# Patient Record
Sex: Male | Born: 1940 | Race: White | Hispanic: No | Smoking: Former smoker
Health system: Southern US, Community
[De-identification: ages and names within clinical notes are randomized; demographics above are authoritative.]

## PROBLEM LIST (undated history)

## (undated) DIAGNOSIS — K219 Gastro-esophageal reflux disease without esophagitis: Secondary | ICD-10-CM

## (undated) DIAGNOSIS — I255 Ischemic cardiomyopathy: Secondary | ICD-10-CM

## (undated) DIAGNOSIS — G8190 Hemiplegia, unspecified affecting unspecified side: Secondary | ICD-10-CM

## (undated) DIAGNOSIS — E039 Hypothyroidism, unspecified: Secondary | ICD-10-CM

## (undated) DIAGNOSIS — K5900 Constipation, unspecified: Secondary | ICD-10-CM

## (undated) DIAGNOSIS — D509 Iron deficiency anemia, unspecified: Secondary | ICD-10-CM

## (undated) DIAGNOSIS — I69991 Dysphagia following unspecified cerebrovascular disease: Secondary | ICD-10-CM

## (undated) DIAGNOSIS — I679 Cerebrovascular disease, unspecified: Secondary | ICD-10-CM

## (undated) DIAGNOSIS — I2510 Atherosclerotic heart disease of native coronary artery without angina pectoris: Secondary | ICD-10-CM

## (undated) DIAGNOSIS — R531 Weakness: Secondary | ICD-10-CM

## (undated) DIAGNOSIS — F17200 Nicotine dependence, unspecified, uncomplicated: Secondary | ICD-10-CM

## (undated) DIAGNOSIS — I639 Cerebral infarction, unspecified: Secondary | ICD-10-CM

## (undated) DIAGNOSIS — E559 Vitamin D deficiency, unspecified: Secondary | ICD-10-CM

## (undated) DIAGNOSIS — I5022 Chronic systolic (congestive) heart failure: Secondary | ICD-10-CM

---

## 2004-03-07 ENCOUNTER — Other Ambulatory Visit: Payer: Self-pay

## 2004-05-28 ENCOUNTER — Other Ambulatory Visit: Payer: Self-pay

## 2004-05-29 ENCOUNTER — Other Ambulatory Visit: Payer: Self-pay

## 2006-09-02 ENCOUNTER — Other Ambulatory Visit: Payer: Self-pay

## 2006-09-02 ENCOUNTER — Inpatient Hospital Stay: Payer: Self-pay | Admitting: Endocrinology

## 2006-10-02 ENCOUNTER — Other Ambulatory Visit: Payer: Self-pay

## 2006-10-02 ENCOUNTER — Emergency Department: Payer: Self-pay | Admitting: Emergency Medicine

## 2006-11-22 ENCOUNTER — Other Ambulatory Visit: Payer: Self-pay

## 2006-11-22 ENCOUNTER — Inpatient Hospital Stay: Payer: Self-pay | Admitting: Endocrinology

## 2006-11-27 ENCOUNTER — Inpatient Hospital Stay: Payer: Self-pay | Admitting: Internal Medicine

## 2006-11-27 ENCOUNTER — Other Ambulatory Visit: Payer: Self-pay

## 2009-11-08 ENCOUNTER — Emergency Department: Payer: Self-pay | Admitting: Emergency Medicine

## 2010-07-22 ENCOUNTER — Inpatient Hospital Stay: Payer: Self-pay | Admitting: Internal Medicine

## 2011-05-30 ENCOUNTER — Ambulatory Visit: Payer: Self-pay | Admitting: Gastroenterology

## 2011-06-07 ENCOUNTER — Ambulatory Visit: Payer: Self-pay | Admitting: Gastroenterology

## 2011-07-12 ENCOUNTER — Ambulatory Visit: Payer: Self-pay | Admitting: Gastroenterology

## 2012-12-26 ENCOUNTER — Ambulatory Visit: Payer: Self-pay | Admitting: Family

## 2013-01-27 LAB — COMPREHENSIVE METABOLIC PANEL
Albumin: 3.9 g/dL (ref 3.4–5.0)
Alkaline Phosphatase: 61 U/L (ref 50–136)
Anion Gap: 8 (ref 7–16)
BUN: 26 mg/dL — ABNORMAL HIGH (ref 7–18)
Co2: 26 mmol/L (ref 21–32)
Creatinine: 1.04 mg/dL (ref 0.60–1.30)
EGFR (Non-African Amer.): >60
Glucose: 91 mg/dL (ref 65–99)
Potassium: 4.6 mmol/L (ref 3.5–5.1)
Sodium: 144 mmol/L (ref 136–145)

## 2013-01-27 LAB — URINALYSIS, COMPLETE
Bilirubin,UR: NEGATIVE
Ph: 5 (ref 4.5–8.0)
Protein: NEGATIVE
RBC,UR: 1 /HPF (ref 0–5)
Specific Gravity: 1.025 (ref 1.003–1.030)
WBC UR: <1 /HPF (ref 0–5)

## 2013-01-27 LAB — CBC
HCT: 36.9 % — ABNORMAL LOW (ref 40.0–52.0)
HGB: 12.1 g/dL — ABNORMAL LOW (ref 13.0–18.0)
MCHC: 32.8 g/dL (ref 32.0–36.0)
Platelet: 172 10*3/uL (ref 150–440)

## 2013-01-27 LAB — TROPONIN I: Troponin-I: 0.06 ng/mL — ABNORMAL HIGH

## 2013-01-28 ENCOUNTER — Observation Stay: Payer: Self-pay | Admitting: Internal Medicine

## 2013-01-28 ENCOUNTER — Ambulatory Visit: Payer: Self-pay | Admitting: Neurology

## 2013-01-28 LAB — TROPONIN I
Troponin-I: 0.07 ng/mL — ABNORMAL HIGH
Troponin-I: 0.07 ng/mL — ABNORMAL HIGH

## 2013-01-29 LAB — BASIC METABOLIC PANEL
Anion Gap: 8 (ref 7–16)
BUN: 22 mg/dL — ABNORMAL HIGH (ref 7–18)
Calcium, Total: 9.1 mg/dL (ref 8.5–10.1)
Chloride: 111 mmol/L — ABNORMAL HIGH (ref 98–107)
Potassium: 3.8 mmol/L (ref 3.5–5.1)
Sodium: 144 mmol/L (ref 136–145)

## 2013-01-29 LAB — CBC WITH DIFFERENTIAL/PLATELET
Basophil #: 0 10*3/uL (ref 0.0–0.1)
Basophil %: 0.5 %
Eosinophil #: 0.2 10*3/uL (ref 0.0–0.7)
HCT: 35.4 % — ABNORMAL LOW (ref 40.0–52.0)
HGB: 11.7 g/dL — ABNORMAL LOW (ref 13.0–18.0)
Monocyte #: 0.9 x10 3/mm (ref 0.2–1.0)
Neutrophil #: 3.9 10*3/uL (ref 1.4–6.5)
Platelet: 163 10*3/uL (ref 150–440)
RBC: 3.71 10*6/uL — ABNORMAL LOW (ref 4.40–5.90)

## 2013-01-29 LAB — LIPID PANEL
HDL Cholesterol: 29 mg/dL — ABNORMAL LOW (ref 40–60)
Ldl Cholesterol, Calc: 53 mg/dL (ref 0–100)

## 2013-08-11 ENCOUNTER — Inpatient Hospital Stay: Payer: Self-pay | Admitting: Internal Medicine

## 2013-08-11 LAB — APTT: Activated PTT: 31.7 secs (ref 23.6–35.9)

## 2013-08-11 LAB — PROTIME-INR
INR: 1.1
Prothrombin Time: 14.7 secs (ref 11.5–14.7)

## 2013-08-11 LAB — COMPREHENSIVE METABOLIC PANEL
Anion Gap: 5 — ABNORMAL LOW (ref 7–16)
BUN: 19 mg/dL — ABNORMAL HIGH (ref 7–18)
Bilirubin,Total: 0.5 mg/dL (ref 0.2–1.0)
Chloride: 109 mmol/L — ABNORMAL HIGH (ref 98–107)
Co2: 27 mmol/L (ref 21–32)
EGFR (Non-African Amer.): >60
Glucose: 106 mg/dL — ABNORMAL HIGH (ref 65–99)
Osmolality: 284 (ref 275–301)
SGOT(AST): 18 U/L (ref 15–37)
SGPT (ALT): 14 U/L (ref 12–78)
Sodium: 141 mmol/L (ref 136–145)
Total Protein: 7.5 g/dL (ref 6.4–8.2)

## 2013-08-11 LAB — CBC
MCH: 31 pg (ref 26.0–34.0)
MCHC: 33.3 g/dL (ref 32.0–36.0)
Platelet: 185 10*3/uL (ref 150–440)

## 2013-08-12 LAB — BASIC METABOLIC PANEL
Anion Gap: 3 — ABNORMAL LOW (ref 7–16)
BUN: 19 mg/dL — ABNORMAL HIGH (ref 7–18)
Calcium, Total: 9.1 mg/dL (ref 8.5–10.1)
Creatinine: 1.05 mg/dL (ref 0.60–1.30)
Glucose: 94 mg/dL (ref 65–99)
Sodium: 140 mmol/L (ref 136–145)

## 2013-08-12 LAB — CBC WITH DIFFERENTIAL/PLATELET
Basophil #: 0.1 10*3/uL (ref 0.0–0.1)
Basophil %: 0.7 %
Eosinophil #: 0.5 10*3/uL (ref 0.0–0.7)
Eosinophil %: 6.3 %
HCT: 29.6 % — ABNORMAL LOW (ref 40.0–52.0)
HGB: 10 g/dL — ABNORMAL LOW (ref 13.0–18.0)
Lymphocyte #: 2.3 10*3/uL (ref 1.0–3.6)
Lymphocyte %: 27.8 %
MCH: 31.1 pg (ref 26.0–34.0)
MCHC: 33.7 g/dL (ref 32.0–36.0)
MCV: 92 fL (ref 80–100)
Neutrophil #: 4.6 10*3/uL (ref 1.4–6.5)
Neutrophil %: 54.3 %
RBC: 3.2 10*6/uL — ABNORMAL LOW (ref 4.40–5.90)
RDW: 14.5 % (ref 11.5–14.5)

## 2013-08-12 LAB — LIPID PANEL
Cholesterol: 121 mg/dL (ref 0–200)
Triglycerides: 104 mg/dL (ref 0–200)
VLDL Cholesterol, Calc: 21 mg/dL (ref 5–40)

## 2013-08-12 LAB — CK TOTAL AND CKMB (NOT AT ARMC)
CK-MB: 1.6 ng/mL (ref 0.5–3.6)
CK-MB: 2 ng/mL (ref 0.5–3.6)

## 2013-08-12 LAB — TROPONIN I
Troponin-I: 0.03 ng/mL
Troponin-I: 0.03 ng/mL

## 2013-08-12 LAB — TSH: Thyroid Stimulating Horm: 2.6 u[IU]/mL

## 2013-08-13 LAB — POTASSIUM: Potassium: 3.2 mmol/L — ABNORMAL LOW (ref 3.5–5.1)

## 2014-03-09 ENCOUNTER — Inpatient Hospital Stay: Payer: Self-pay | Admitting: Internal Medicine

## 2014-03-09 LAB — URINALYSIS, COMPLETE
BACTERIA: NONE SEEN
BILIRUBIN, UR: NEGATIVE
BLOOD: NEGATIVE
GLUCOSE, UR: NEGATIVE mg/dL (ref 0–75)
Hyaline Cast: 8
Ketone: NEGATIVE
LEUKOCYTE ESTERASE: NEGATIVE
Nitrite: NEGATIVE
PH: 5 (ref 4.5–8.0)
Protein: NEGATIVE
RBC,UR: NONE SEEN /HPF (ref 0–5)
Specific Gravity: 1.016 (ref 1.003–1.030)
WBC UR: 1 /HPF (ref 0–5)

## 2014-03-09 LAB — CBC
HCT: 33.6 % — AB (ref 40.0–52.0)
HGB: 10.9 g/dL — ABNORMAL LOW (ref 13.0–18.0)
MCH: 30.5 pg (ref 26.0–34.0)
MCHC: 32.4 g/dL (ref 32.0–36.0)
MCV: 94 fL (ref 80–100)
Platelet: 186 10*3/uL (ref 150–440)
RBC: 3.57 10*6/uL — AB (ref 4.40–5.90)
RDW: 15.8 % — AB (ref 11.5–14.5)
WBC: 9 10*3/uL (ref 3.8–10.6)

## 2014-03-09 LAB — COMPREHENSIVE METABOLIC PANEL
ALBUMIN: 3.7 g/dL (ref 3.4–5.0)
AST: 25 U/L (ref 15–37)
Alkaline Phosphatase: 61 U/L
Anion Gap: 3 — ABNORMAL LOW (ref 7–16)
BUN: 36 mg/dL — ABNORMAL HIGH (ref 7–18)
Bilirubin,Total: 0.4 mg/dL (ref 0.2–1.0)
CHLORIDE: 106 mmol/L (ref 98–107)
CO2: 30 mmol/L (ref 21–32)
Calcium, Total: 9 mg/dL (ref 8.5–10.1)
Creatinine: 1.52 mg/dL — ABNORMAL HIGH (ref 0.60–1.30)
EGFR (African American): 52 — ABNORMAL LOW
GFR CALC NON AF AMER: 45 — AB
Glucose: 158 mg/dL — ABNORMAL HIGH (ref 65–99)
OSMOLALITY: 289 (ref 275–301)
Potassium: 4 mmol/L (ref 3.5–5.1)
SGPT (ALT): 16 U/L (ref 12–78)
Sodium: 139 mmol/L (ref 136–145)
TOTAL PROTEIN: 7.7 g/dL (ref 6.4–8.2)

## 2014-03-09 LAB — TROPONIN I: Troponin-I: <0.02 ng/mL

## 2014-03-10 LAB — CBC WITH DIFFERENTIAL/PLATELET
Basophil #: 0.1 10*3/uL (ref 0.0–0.1)
Basophil %: 0.8 %
Eosinophil #: 0.7 10*3/uL (ref 0.0–0.7)
Eosinophil %: 8.3 %
HCT: 31.7 % — ABNORMAL LOW (ref 40.0–52.0)
HGB: 10.5 g/dL — ABNORMAL LOW (ref 13.0–18.0)
LYMPHS PCT: 24.2 %
Lymphocyte #: 2.1 10*3/uL (ref 1.0–3.6)
MCH: 30.9 pg (ref 26.0–34.0)
MCHC: 33 g/dL (ref 32.0–36.0)
MCV: 94 fL (ref 80–100)
Monocyte #: 1.1 x10 3/mm — ABNORMAL HIGH (ref 0.2–1.0)
Monocyte %: 12.4 %
NEUTROS PCT: 54.3 %
Neutrophil #: 4.7 10*3/uL (ref 1.4–6.5)
PLATELETS: 168 10*3/uL (ref 150–440)
RBC: 3.39 10*6/uL — AB (ref 4.40–5.90)
RDW: 15.6 % — ABNORMAL HIGH (ref 11.5–14.5)
WBC: 8.6 10*3/uL (ref 3.8–10.6)

## 2014-03-10 LAB — BASIC METABOLIC PANEL
ANION GAP: 5 — AB (ref 7–16)
BUN: 27 mg/dL — ABNORMAL HIGH (ref 7–18)
CHLORIDE: 107 mmol/L (ref 98–107)
Calcium, Total: 8.9 mg/dL (ref 8.5–10.1)
Co2: 28 mmol/L (ref 21–32)
Creatinine: 1.15 mg/dL (ref 0.60–1.30)
EGFR (African American): >60
GLUCOSE: 85 mg/dL (ref 65–99)
Osmolality: 284 (ref 275–301)
POTASSIUM: 3.8 mmol/L (ref 3.5–5.1)
SODIUM: 140 mmol/L (ref 136–145)

## 2014-03-10 LAB — TSH: THYROID STIMULATING HORM: 1.09 u[IU]/mL

## 2014-08-03 ENCOUNTER — Ambulatory Visit: Payer: Self-pay | Admitting: Family

## 2014-11-21 IMAGING — CT CT HEAD WITHOUT CONTRAST
1 series · 15 of 30 positions shown, 19 images · non-contrast
Comparison: Brain MRI 01/28/2013.

CLINICAL DATA: Weakness.  Syncope.

EXAM:
CT HEAD WITHOUT CONTRAST
TECHNIQUE: Contiguous axial images were obtained from the base of the skull
through the vertex without intravenous contrast.

[Series 2: head wo · axial · 0.44mm/px · z∈[-83,+43]mm · 15 of 32 slices shown, 19 images]
[im 2/32  brain]
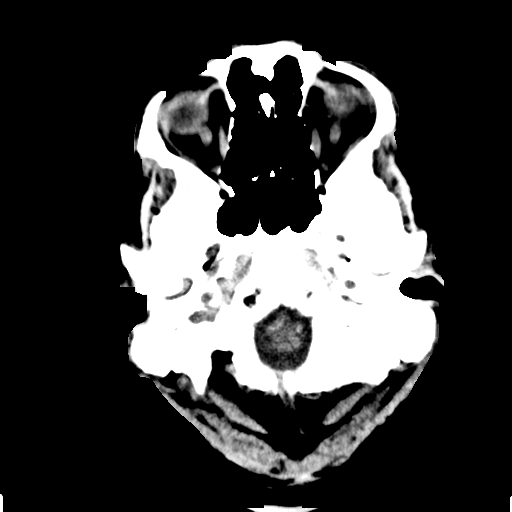
[im 2/32  bone]
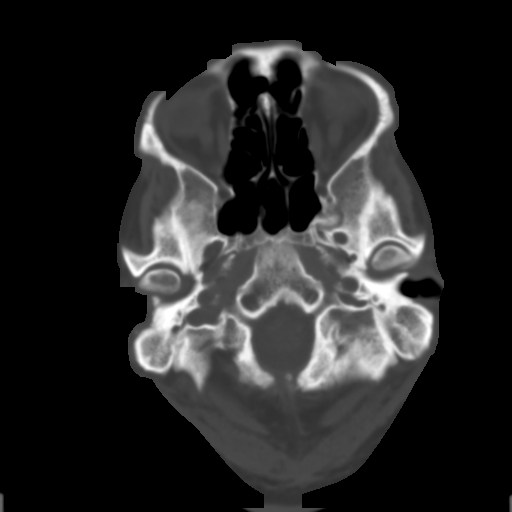
[im 4/32  brain]
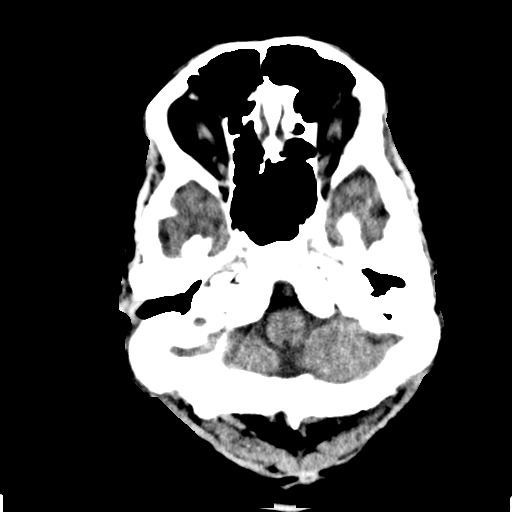
[im 6/32  brain]
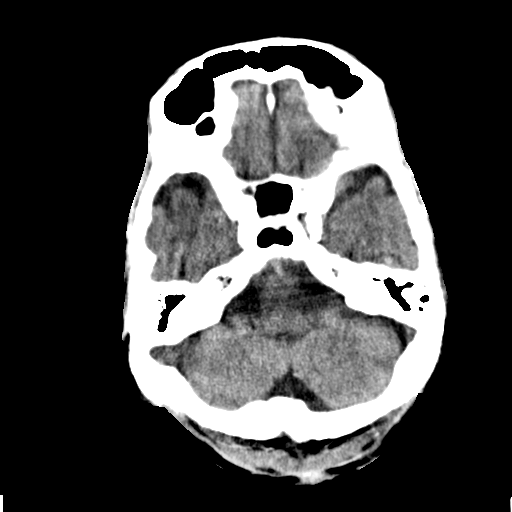
[im 8/32  brain]
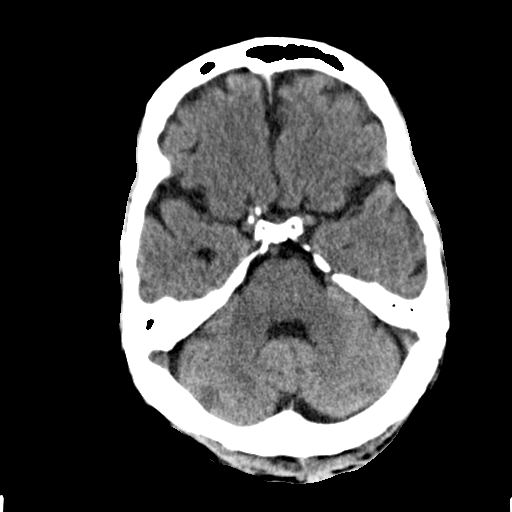
[im 10/32  brain]
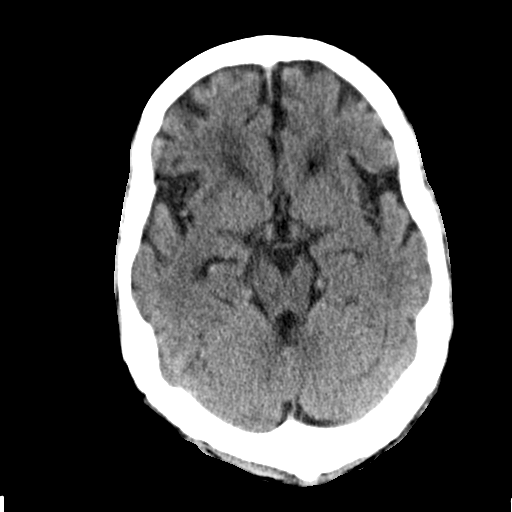
[im 10/32  bone]
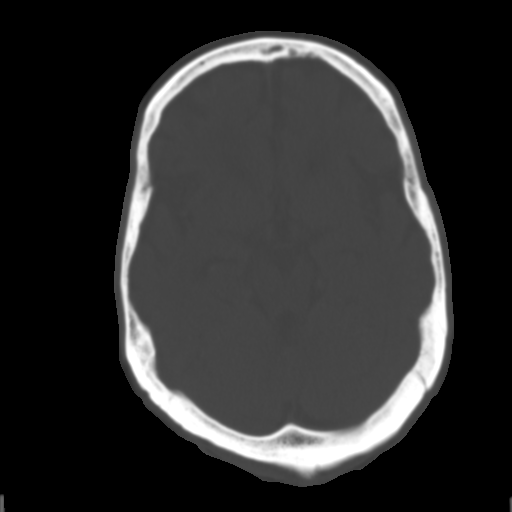
[im 12/32  brain]
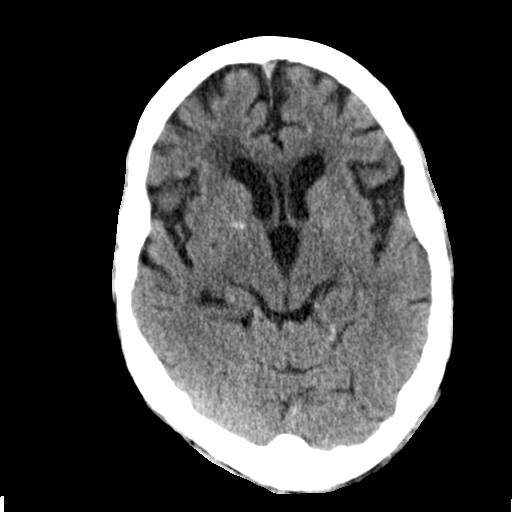
[im 14/32  brain]
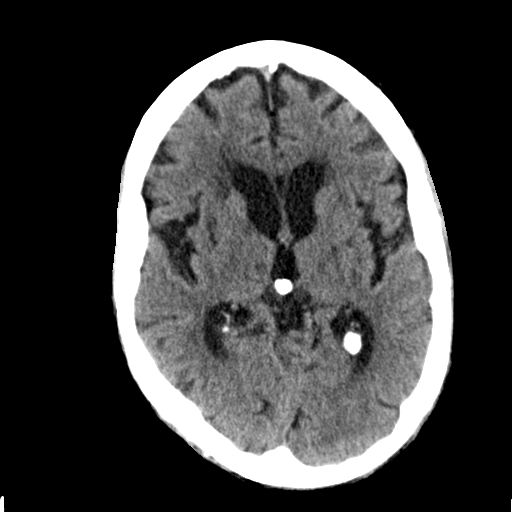
[im 17/32  brain]
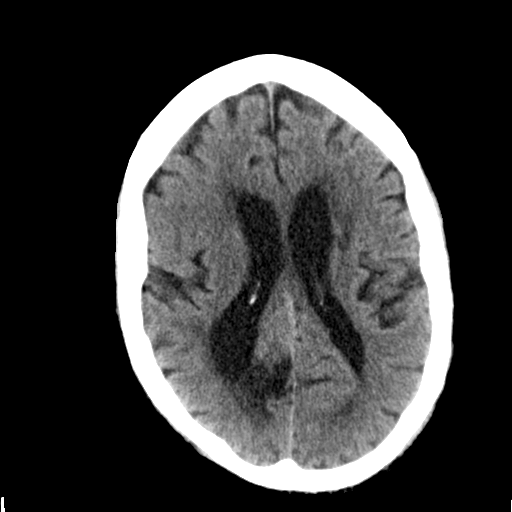
[im 18/32  brain]
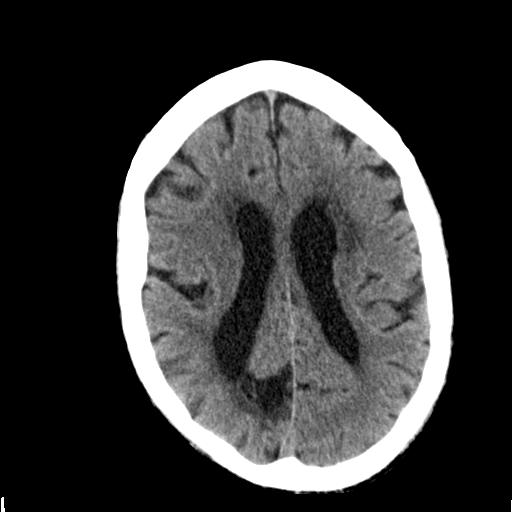
[im 18/32  bone]
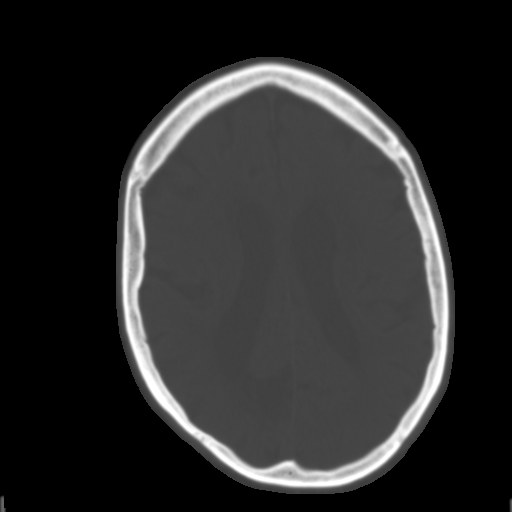
[im 20/32  brain]
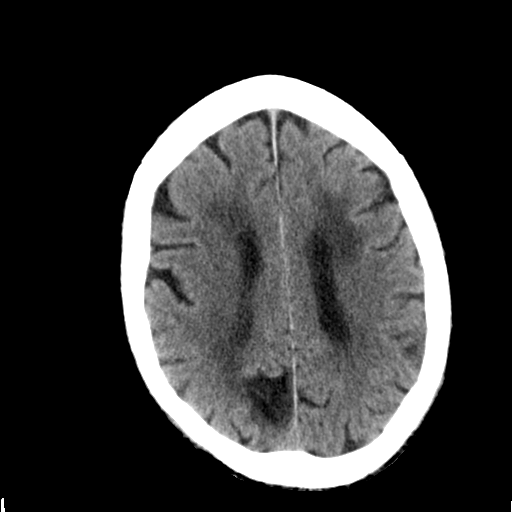
[im 22/32  brain]
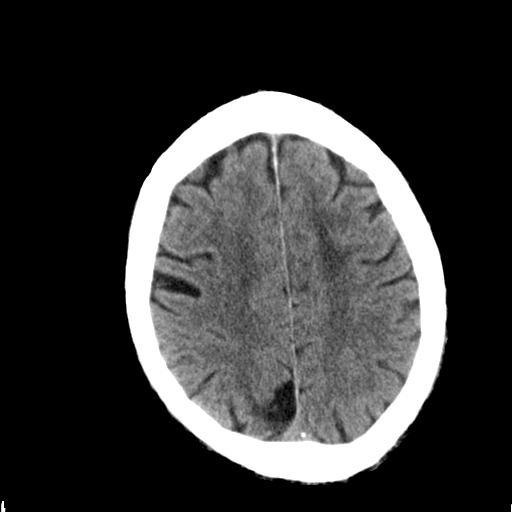
[im 24/32  brain]
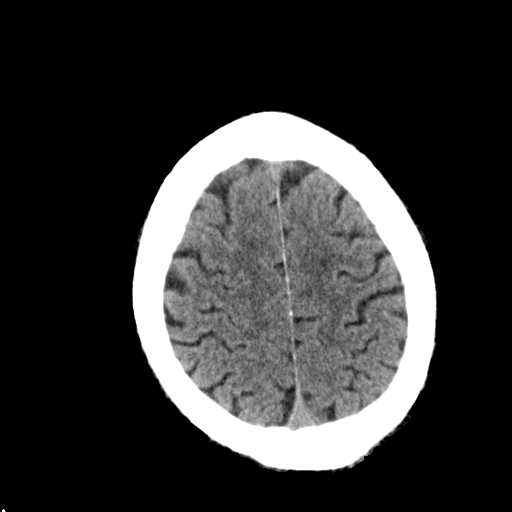
[im 26/32  brain]
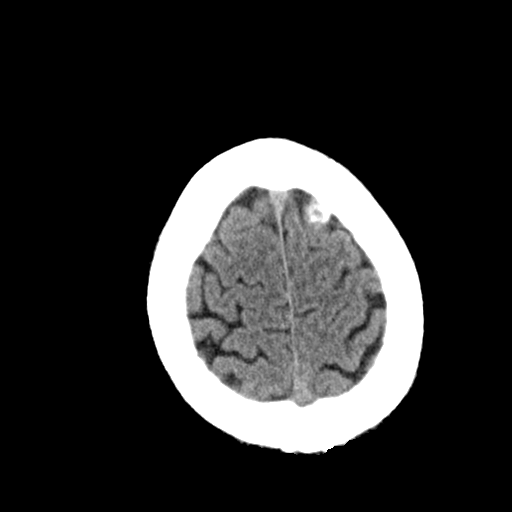
[im 26/32  bone]
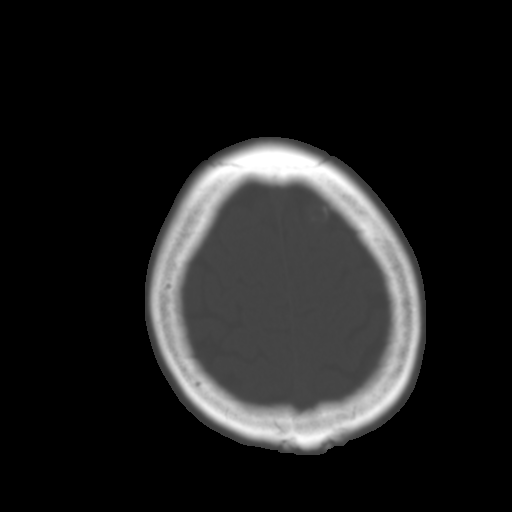
[im 28/32  brain]
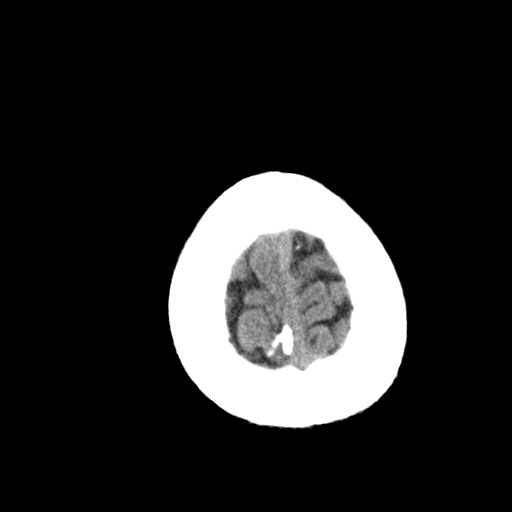
[im 30/32  brain]
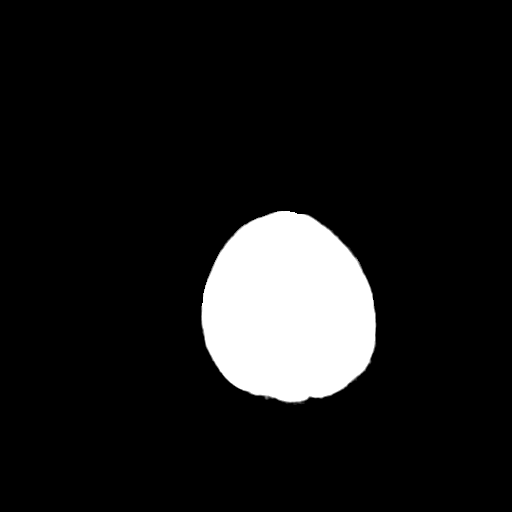

[15 of 30 positions shown; findings below may reference images not displayed]

FINDINGS: Well-defined area of low attenuation in the medial aspect of the
right parietal lobe is compatible with an area of encephalomalacia
related to remote infarction. Several well-defined foci of low
attenuation are noted throughout the basal ganglia bilaterally,
compatible with multifocal old lacunar infarctions. Physiologic
calcifications in the basal ganglia bilaterally (right greater than
left). Patchy and confluent areas of decreased attenuation are noted
throughout the deep and periventricular white matter of the cerebral
hemispheres bilaterally, compatible with chronic microvascular
ischemic disease. Mild cerebral and cerebellar atrophy. No acute
intracranial abnormalities. Specifically, no evidence of acute
intracranial hemorrhage, no definite findings of acute/subacute
cerebral ischemia, no mass, mass effect, hydrocephalus or abnormal
intra or extra-axial fluid collections. Visualized paranasal sinuses
and mastoids are well pneumatized. No acute displaced skull
fractures are identified.
IMPRESSION: 1. No acute intracranial abnormalities.
2. Encephalomalacia from old medial right parietal lobe infarction
is unchanged. There also multifocal old bilateral basal ganglia
lacunar infarcts.
3. Mild cerebral and cerebellar atrophy with extensive chronic
microvascular ischemic changes throughout cerebral white matter, as
above.

## 2015-04-16 NOTE — Discharge Summary (Signed)
PATIENT NAME:  Samuel Oneill, Samuel Oneill MR#:  [id number] DATE OF BIRTH:  01-14-1941  DATE OF ADMISSION:  08/11/2013 DATE OF DISCHARGE:  08/13/2013  ADMITTING PHYSICIAN: Shreyang H. Allena Katz, MD  DISCHARGING PHYSICIAN: Enid Baas, MD  PRIMARY CARE PHYSICIAN: Dr. Shawn Stall at the [facility] program.  CONSULTATIONS IN THE HOSPITAL: Cardiology consultation by Dr. Juliann Pares.   DISCHARGE DIAGNOSES: 1.  Acute on chronic systolic congestive heart failure exacerbation. Ejection fraction is 35% to 40%, improved up to 45% on current echo.  2.  Hypertension.  3.  Coronary artery disease.  4.  Hypothyroidism.  5.  History of cerebrovascular accident with left facial droop.   DISCHARGE HOME MEDICATIONS:   1.  Plavix 75 mg p.o. daily.  2.  Simvastatin 40 mg p.o. at bedtime. 3.  Toprol 50 mg p.o. daily.  4.  Levothyroxine 100 mcg p.o. daily.  5.  Protonix 40 mg p.o. daily.  6.  Vitamin D3, 1000 international units p.o. daily.  7.  Loperamide 2 mg capsule q.4 hours p.r.n. for diarrhea.  8.  Zoloft 75 mg p.o. daily.  9.  Metformin 1000 mg p.o. daily.  10.  Meclizine 25 mg p.o. q.8 hours p.r.n. for dizziness.  11.  Lisinopril 10 mg p.o. daily.  12.  Sublingual nitroglycerin 0.4 mg every 5 minutes p.r.n. for chest pain.  13.  Lasix 40 mg p.o. daily.  14.  Klor-Con 20 mEq p.o. daily.   DISCHARGE DIET: Low-sodium diet.   DISCHARGE ACTIVITY: As tolerated.    FOLLOWUP INSTRUCTIONS: PCP followup in 1 week.   LABORATORY AND IMAGING STUDIES PRIOR TO DISCHARGE: Echo Doppler showing LV ejection fraction improved to 45%, decreased global left ventricular systolic function, dilated left and right atria. Mild mitral valve regurgitation is present. Moderate aortic regurgitation is present. Nuclear stress test revealing moderate to severely depressed LV ejection fraction, EF of 38%, large anterior apical and inferior scar with no significant active reversible ischemia noted. Overall normal study and no artifact  noted.   WBC 8.4, hemoglobin 10.0, hematocrit 29.6, platelet count 175.   Sodium 140, potassium 4.1, chloride 107, bicarbonate 30, BUN 19, creatinine 1.05, glucose 94 and calcium of 9.1. LDL cholesterol 71, HDL 29, total cholesterol 914, triglycerides 104. TSH 2.6. Troponins remained negative while in the hospital. INR 1.1. Chest x-ray on admission revealing cardiomegaly with pulmonary vascular prominence seen. BNP was elevated at 9930.   BRIEF HOSPITAL COURSE: Samuel Oneill is a 74 year old elderly Caucasian Oneill with past medical history significant for prior history of CVA with left homonymous hemianopsia and also left facial droop, hypertension, diabetes, history of coronary artery disease, anemia, hypothyroidism and congestive heart failure with known EF of 35%, presented to the hospital secondary to worsening chest pressure and also dyspnea on exertion. The patient was noted to be in CHF exacerbation.  1.  Acute on chronic systolic CHF exacerbation, last echo showing EF of 35%: The patient was admitted, stat diuresed with IV Lasix 30 mg IV b.i.d. It looks like patient  was not taking any Lasix at home. He had a Myoview done because of his risk factors and chest pressure he complained of, which did not show any active ischemia, but EF was severely reduced to 38%. Echo done here prior to discharge shows improvement in EF up to 45%. The patient was being discharged on Lasix along with potassium supplements. He is already on Plavix, metoprolol, and lisinopril was added for his systolic CHF with depressed ejection fraction. The patient is also on simvastatin.  He was breathing better and has been weaned to room air. Did not have any further dyspnea and felt ready to be discharged.  2.  Diabetes mellitus: Metformin was continued.  3.  History of prior CVA: The patient is on Plavix at this time. He has left homonymous hemianopsia and he ambulates using a walker at baseline. His course has been otherwise  uneventful in the hospital.   DISCHARGE CONDITION: Stable.   DISCHARGE DISPOSITION: Home. Will be followed by [facility].  TIME SPENT ON DISCHARGE: 45 minutes.   ____________________________ Enid Baasadhika Quavion Boule, MD rk:jm D: 08/13/2013 15:34:00 ET T: 08/13/2013 22:31:46 ET JOB#: [id number]  cc: Enid Baasadhika Amali Uhls, MD, <Dictator> Kristopher OppenheimJane D. Shawn StallHollingsworth, MD Enid BaasADHIKA Jarrick Fjeld MD ELECTRONICALLY SIGNED 09/03/2013 16:09

## 2015-04-16 NOTE — Consult Note (Signed)
PATIENT NAME:  Samuel DitchHOMPSON, Kendra C MR#:  [id number] DATE OF BIRTH:  22-Jun-1941  CARDIAC CONSULTATION  DATE OF CONSULTATION:  08/12/2013  REFERRING PHYSICIAN:  Allena KatzPatel, MD CONSULTING PHYSICIAN:  Rayhaan Huster D. Juliann Paresallwood, MD  PRIMARY CARE PROVIDER:  Shawn StallHollingsworth, MD  INDICATION:  Chest pain and shortness of breath, known coronary artery disease.   HISTORY OF PRESENT ILLNESS: The patient is a 74 year old white male with a history of CVA, hypertension, diabetes, hyperlipidemia, coronary artery disease, cardiomyopathy reports of having shortness of breath over the last several days, has had trouble with going to sleep, stated that he had progressive shortness of breath on and off. Day before admission pressure-squeezing tightness, congestion in an upper respiratory area. Denies any fevers. He has had some swelling in his lower extremities and abdominal bloating and distention. He has had again some chest discomfort; so finally presented for further evaluation and care.   REVIEW OF SYSTEMS: Denies blackout spells or syncope. Denies nausea or vomiting. No fever. No chills. No sweats. Denies weight loss, weight gain. No hemoptysis or hematemesis. Denies bright red blood per rectum. He has had shortness of breath, congestion, abdominal distention, chest pain.  PAST MEDICAL HISTORY: CVA, hypertension, chronic headaches, diabetes, hyperlipidemia, history of myocardial infarction, hyponatremia, anemia, hypothyroidism, cardiomyopathy.   PAST SURGICAL HISTORY: Status post stent, coronary artery bypass surgery, abdominal hernia, cholecystectomy, back surgery.   ALLERGIES:  AMBIEN.   FAMILY HISTORY:  Diabetes.   SOCIAL HISTORY:  No smoking or alcohol consumption.   MEDICATIONS: 1.  Levothyroxine 100 mcg daily. 2.  Meclizine 25 q. 8 hours. 3.  Metformin 1000 daily. 4.  Plavix 75 daily.  5.  Protonix 40 daily. 6.  Simvastatin 40. 7.  Toprol-XL 50. 8.  Vitamin D. 9.  Zoloft 50, 1-1/2 tablets daily.    PHYSICAL EXAMINATION: VITAL SIGNS: Blood pressure 170/70, pulse 70, respiratory rate 16, afebrile.  HEENT: Normocephalic, atraumatic. Pupils equal and reactive to light.  NECK: Supple. No significant JVD, bruits or adenopathy.  LUNGS: Clear to auscultation and percussion. No significant wheeze, rhonchi or rales.  HEART: Regular rate and rhythm. Systolic ejection murmur left sternal border. Positive S3. Soft S4. PMI nondisplaced.  ABDOMEN: Benign.  EXTREMITIES: Within normal limits.  NEUROLOGIC: Intact.  SKIN: Normal.  LABORATORY, DIAGNOSTIC, AND RADIOLOGICAL DATA:  Glucose 106, BUN 19, creatinine 0.98, sodium 141, potassium 3.5, chloride 109, CO2 27. White count 84, hemoglobin 10, platelet count 185. LFTs negative. Troponin negative.   EKG: Normal sinus rhythm, first degree AV block left axis deviation, left bundle branch block, otherwise negative.   ASSESSMENT:  Chest pain, possible angina, coronary artery disease, acute systolic heart failure, hypertension, hyperlipidemia, hypothyroidism, abnormal EKG.   PLAN: Agree with admit. Rule out for myocardial infarction and follow up cardiac enzymes and followup EKG. Lasix therapy for possible congestive heart failure. Short-term anticoagulation. Consider Myoview. Consider echocardiogram. Would recommend medical therapy unless enzymes_____ positive, agree with Toprol, consider ACE inhibitor therapy or ARB. Continue diabetes medication. Continue lipid management with simvastatin. Recommend physical therapy to help with activity. Would treat the patient medically for now unless symptoms persist, worsen or recur.    ____________________________ Bobbie Stackwayne D. Juliann Paresallwood, MD ddc:ce D: 08/13/2013 12:29:55 ET T: 08/13/2013 12:52:23 ET JOB#: [id number]  cc: Juleon Narang D. Juliann Paresallwood, MD, <Dictator> Alwyn PeaWAYNE D Meredith Mells MD ELECTRONICALLY SIGNED 09/01/2013 11:50

## 2015-04-16 NOTE — Discharge Summary (Signed)
PATIENT NAME:  Samuel Oneill, Samuel Oneill MR#:  [id number] DATE OF BIRTH:  1941/11/09  DATE OF ADMISSION:  01/28/2013 DATE OF DISCHARGE:  01/29/2013  PRIMARY CARE PHYSICIAN: Lavetta Nielsen, MD  DISCHARGE DIAGNOSES: 1. Vertigo. 2. Transient ischemic attack.  3. History of cerebrovascular accident.  4. Hypertension.  5. Diabetes mellitus, type 2. 6. Coronary artery disease, stable. 7. Ischemic cardiomyopathy.   CONSULTANT: Pauletta Browns, MD - Neurology.   IMAGING STUDIES: MRI of the brain showed old right parietal lobe infarct. No acute intracranial pathology.   Carotid ultrasounds done recently, 3 weeks prior, showed no significant carotid stenosis.   2-D echocardiogram is pending at this time.   ADMITTING HISTORY AND PHYSICAL:  Please see detailed H and P dictated by Dr. Randol Kern. In brief, this is a 74 year old patient with history of CVA, hypertension and type 2 diabetes mellitus with chronic recurrent falls and gait abnormalities with vertigo who presented to the hospital complaining of worsening dizziness. The patient was unable to take care of himself as his friend, who used to help take care him, was admitted to the hospital herself. The patient was admitted for TIA, CVA work-up and control of blood pressure.   HOSPITAL COURSE:  1. TIA. The patient had a MRI of the brain which showed an old parietal stroke. No posterior circulation TIA. Carotid ultrasounds done recently were normal. A 2-D echocardiogram is pending. If the 2-D echocardiogram shows no signs of thrombus, the patient will be discharged to skilled nursing facility for further rehabilitation, as recommended by PT. The patient is being discharged on statin hypertensive medications of Plavix and aspirin and for further physical therapy.  2. The patient did have elevated troponin of 0.07 with mild elevation in CK, but MB negative. No chest pain, shortness of breath, and this is trending down and his coronary artery disease is  stable.   Presently, the patient's temperature is 98.2, pulse 74, blood pressure 123/75, saturating 99% on room air and is being discharged to skilled nursing facility.   DISCHARGE MEDICATIONS: 1. Plavix 75 mg oral once a day.  2. Aspirin 81 mg oral once a day.  3. Simvastatin 40 mg oral once a day. 4. Toprol-XL 50 mg oral once a day.  5. Levothyroxine 100 mcg oral once a day.  6. Protonix 40 mg oral once a day.  7. Tylenol 325 mg 2 tablets every 4 hours as needed for pain or fever.  9. Vitamin D3 1000 international units once a day.  10. Astelin 137 mcg two sprays nasal 2 times a day. 11. Simethicone 180 mg oral 2 times a day as needed.  12. Zofran 4 mg oral every 8 hours as needed for nausea and vomiting.  13. Loperamide 2 mg oral every 4 hours as needed for diarrhea.  14. Zoloft 50 mg oral once a day. 15. Lisinopril 10 mg oral once a day.  16. Metformin 1000 mg oral 2 times a day.            DISCHARGE INSTRUCTIONS: The patient will be on a low sodium, low fat, carbohydrate-controlled diet for diabetes of regular consistency. Activity as tolerated with assistance at all times. Follow up with the rehab physician in 1 to 2 days.   TIME SPENT: On day of discharge in discharge activity was 35 minutes. ____________________________ Molinda Bailiff Gimena Buick, MD srs:sb D: 01/29/2013 11:32:30 ET T: 01/29/2013 11:56:51 ET JOB#: [id number]  cc: Wardell Heath R. Kannan Proia, MD, <Dictator> Kristopher Oppenheim. Shawn Stall, MD Wardell Heath [place] Bali MD ELECTRONICALLY  SIGNED 01/30/2013 13:26

## 2015-04-16 NOTE — Consult Note (Signed)
PATIENT NAME:  Samuel Oneill, Samuel Oneill MR#:  [id number] DATE OF BIRTH:  Jul 18, 1941  DATE OF CONSULTATION:  01/29/2013  REFERRING PHYSICIAN:   CONSULTING PHYSICIAN:  Pauletta BrownsYuriy Eura Radabaugh, MD  HISTORY OF PRESENT ILLNESS: This is a 74 year old gentleman with past medical history of prior ischemic stroke, hypertension, diabetes and hyperlipidemia admitted with falls. As per the patient, he has had multiple falls in the past 6 to 7 months, about 5 or 6. Reason for evaluation is for evaluation of gait status and strength. As per the patient, he states since he has been admitted to the hospital he feels much better, his strength is improved and he is able to lift himself out of the bed with minimal assistance and he thinks that his gait is improved and decreased falling to the right side. He is status post MRI of the brain which was reviewed and did not show any new intracranial pathology.   PHYSICAL EXAMINATION:  VITAL SIGNS: Temperature 98.2, pulse 74, respirations 20, blood pressure 123/75 and pulse ox is 99% at rest in room air. NEUROLOGIC:  He is awake, alert and oriented to place, time, location and date. He is able to tell me the president of the [name]. On cranial nerve examination, extraocular movements are intact. Pupils are 4 mm to 2 mm, symmetrical, bilaterally. No visual field cuts are noted. Suspect left facial droop is still present from yesterday. Facial sensation is intact. Tongue is midline. On motor examination, 4+ in the upper extremities and 4 in bilateral lower extremities. Reflexes are 1+ throughout, except the right patella which is 2+. When ambulating the patient still has slight difficulty making turns and is still prone to risk of falls.   IMPRESSION AND RECOMMENDATIONS: A 74 year old male with multiple falls, on aspirin and Plavix for both stroke and cardiac prevention. At this time, he should finish his stroke work-up as is ordered. Continue physical therapy, occupational therapy and  social work evaluation for possible placement. I do not think the patient is safe enough to be transferred to home. At this point, as the patient is on full dose of aspirin and Plavix, if not indicated by         cardiology to continue that, would just leave him on Plavix 75 daily. There is no indication for dual antiplatelet therapy for stroke prevention.   Thank you. It was a pleasure seeing this patient.  ____________________________ Pauletta BrownsYuriy Annalissa Murphey, MD yz:sb D: 01/29/2013 11:11:48 ET T: 01/29/2013 11:26:27 ET JOB#: [id number]  cc: Pauletta BrownsYuriy Aryel Edelen, MD, <Dictator> Pauletta BrownsYURIY Yisel Megill MD ELECTRONICALLY SIGNED 02/02/2013 18:11

## 2015-04-16 NOTE — H&P (Signed)
PATIENT NAME:  Samuel Oneill, Samuel Oneill MR#:  [id number] DATE OF BIRTH:  Aug 26, 1941  DATE OF ADMISSION:  08/11/2013  PRIMARY CARE PROVIDER:  Dr. Shawn Stall   ED REFERRING PHYSICIAN:  Dr. Mayford Knife  CHIEF COMPLAINT: Chest pressure, shortness of breath.   HISTORY OF PRESENT ILLNESS:  The patient is a 74 year old white male with history of previous CVA, hypertension, type 2 diabetes, hyperlipidemia, coronary artery disease, cardiomyopathy, who reports that over the past few days he has had progressive shortness of breath, especially at night when he is lying down. He gets very short of breath, and has to go and sleep in a recliner. Also starting today, he started getting short of breath during the day. The patient also starts having on and off pressure since yesterday. He describes the pressure as a squeezing type of sensation. The patient also reports that he has congestion in his upper respiratory tract. He has not had any fevers or chills. No cough. He also complains of lower extremity swelling and edema. He also complains of abdominal bloating and distention.   PAST MEDICAL HISTORY IS SIGNIFICANT FOR:  1.  Left sided CVA after left homonymous hemianopsia involving posterior cerebral artery.  2.  Hypertension.  3.  History of chronic headaches.  4.  Type 2 diabetes.  5.  Hyperlipidemia.  6.  History of MI.   7.  Hyponatremia.  8.  History of chronic anemia.  9.  History of hypothyroidism.  10.  Cardiomyopathy.   PAST SURGICAL HISTORY:  1.  Status post cardiac stent. 2.  Status post CABG.  3.  Status post abdominal hernia.  4.  Status post cholecystectomy. 5.  Status post back surgery.   CURRENT HOME MEDICATIONS: Levothyroxine 100 mcg daily,   2 mg q. 4 p.r.n., meclizine 25 q. 8 p.r.n., metformin 1000 mg daily, Plavix 75 p.o. daily, Protonix 40 mg 1 tab p.o. daily, simvastatin 40 at bedtime, Toprol-XL 50 daily, vitamin D3, 1000 international units daily, Zoloft 50, 1-1/2 tabs daily.    ALLERGIES:  AMBIEN.   FAMILY HISTORY: Significant for diabetes.   SOCIAL HISTORY: Does not smoke. Does not drink.   REVIEW OF SYSTEMS: CONSTITUTIONAL: Denies any fevers. Complains of fatigue, weakness. No weight loss. No weight gain.  EYES: No blurred or double vision. No pain. No redness.  EARS, NOSE, THROAT:  No tinnitus. No ear pain. No hearing loss. No seasonal or year-round allergies.  RESPIRATORY:  Denies any cough, wheezing, hemoptysis. Has chronic dyspnea.  CARDIOVASCULAR:  Complains of chest pressure, orthopnea, edema or dyspnea on exertion. No palpitations.  GASTROINTESTINAL:  No nausea, vomiting, diarrhea. Complains of abdominal distention.  GENITOURINARY:  Denies any dysuria, hematuria, renal calculus or frequency.  ENDOCRINE: Denies any polyuria, nocturia, increased sweating, heat or cold intolerance. HEMATOLOGIC AND LYMPHATIC:  Denies anemia, easy bruisability or bleeding.  SKIN:  No acne. No rash. No changes in mole, hair or skin.  MUSCULOSKELETAL: Denies any pain in neck, back or shoulder.  NEUROLOGIC:  Has history of CVA and TIA. No seizures.  PSYCHIATRIC:  No anxiety, insomnia, or ADD.   PHYSICAL EXAMINATION: VITAL SIGNS:  Temperature 97.8, pulse 68, respirations 18, blood pressure 168/76, O2 is 95% on room air.  GENERAL:  The patient is an obese Caucasian male, currently not in any acute distress.  HEENT: Head atraumatic, normocephalic. Pupils equally round, reactive to light and accommodation. There is no conjunctival pallor. No scleral icterus. Nasal exam shows no drainage or ulceration. External ear exam shows no drainage or  external lesions. Mouth is moist, without any exudate.  NECK:  Supple, without any JVD or carotid bruits.  RESPIRATORY:  He has occasional crackles bilaterally.  CARDIOVASCULAR:  Regular rate and rhythm. No murmurs, clicks, gallops or heaves. PMI is not displaced.  ABDOMEN: Soft, nontender, nondistended. Positive bowel sounds x 4. There is no  hepatosplenomegaly.  EXTREMITIES:  Have 1+ edema.  SKIN:  No rash.  LYMPHATICS:  No lymph nodes palpable.  VASCULAR:  Good DP, PT pulses.  PSYCHIATRIC:  Not anxious or depressed.  NEUROLOGICAL: Awake, alert, oriented x 3. No focal deficits.  LYMPHATICS:  No lymph nodes palpable.  EVALUATIONS:  Glucose 106, BUN 19, creatinine 0.98, sodium 141, potassium 3.5, chloride 109, CO2 is 27, calcium 9.0. LFTs are normal. WBCs 8.4, hemoglobin 10.2, platelet count 185. CPK 56, CK-MB is 2.0. Troponin 0.03. INR is 1.1. EKG shows sinus rhythm with first-degree AV block, left axis deviation, left bundle branch block. He has a history of having left anterior fascicular block.   ASSESSMENT AND PLAN: The patient is a 74 year old with history of coronary artery disease, cardiomyopathy, with ejection fraction of 25% noted previously. He presents with chest pressure, shortness of breath, lower extremity swelling.   1.  Chest pressure, with history of coronary artery disease. Could be angina. At this time, will place him on aspirin, check serial enzymes. Will go ahead and get a Lexi MIBI in the morning. Will have his cardiologist come evaluate the patient.   2. Acute systolic congestive heart failure. Will treat him with IV Lasix. Repeat an echocardiogram.   3.  Hypertension. Will continue Toprol-XL.  4.  Hyperlipidemia. Continue simvastatin. Will check a fasting lipid panel in the morning.  5.  Hypothyroidism. Continue levothyroxine.  6.  Miscellaneous:  Will place him on Lovenox for DVT prophylaxis.  Note:  45 minutes spent on this patient.     ____________________________ Lacie ScottsShreyang H. Allena KatzPatel, MD shp:mr D: 08/11/2013 19:13:43 ET T: 08/11/2013 20:53:43 ET JOB#: [id number]  cc: Amen Dargis H. Allena KatzPatel, MD, <Dictator> Charise CarwinSHREYANG H Jourdin Gens MD ELECTRONICALLY SIGNED 08/18/2013 13:11

## 2015-04-16 NOTE — H&P (Signed)
PATIENT NAME:  Samuel Oneill, [name] MR#:  [id number] DATE OF BIRTH:  01-12-1941  DATE OF ADMISSION:  01/28/2013  PRIMARY CARE PHYSICIAN: Lavetta NielsenJane Hollingsworth, MD  REFERRING PHYSICIAN: Bayard Malesandolph Brown, MD    CHIEF COMPLAINT: Dizziness and fall.   HISTORY OF PRESENT ILLNESS: This is a 74 year old male with significant past medical history of CVA, hypertension, type 2 diabetes, hyperlipidemia, coronary artery disease, cardiomyopathy, presents with an episode of dizziness and fall. The patient reports he is living in an assisted living facility with a roommate.  He reports he had an episode of dizziness where then he felt weak all over, and then he fell after that. He denies any loss of consciousness, any seizure-like activity, any tongue biting or any stool or urinary incontinence, reports he never lost consciousness, was awake.  He reports he felt weak and was on the floor for some time until his roommate called EMS. The patient denies any lightheadedness. He describes it as he felt the room was spinning on him. The patient is known to have history of CVA in the past with left homonymous hemianopsia due to acute CVA in the posterior cerebral artery, where he reports he has left visual field deficit which is chronic, which did not change this time.  He currently reports his dizziness is much improved, almost resolved. The patient denies any chest pain, any shortness of breath, any palpitation, any nausea, any new or focal deficits or tingling or numbness. The patient had a CT of the brain done in ED which did not show any acute finding. The patient is on home aspirin and Plavix and reports he has been taking them regularly. The patient had basic blood work done which did show some mild elevation of troponin at 0.06 which is at the upper limit of normal levels. The patient denied any chest pain or any shortness of breath.  His EKG was compared where there were some T waves noted in a couple of the leads.  The patient  was given 324 of aspirin in the ED. Hospitalist Service was requested to admit the patient for further work-up of his symptoms.   PAST MEDICAL HISTORY:  1. 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  2. Hypertension.  3. Headache.  4. Type 2 diabetes.  5. Hyperlipidemia.  6. History of MI.  7. Hyponatremia.  8. Anemia.  9. Hypothyroidism.  10. Cardiomyopathy.   PAST SURGICAL HISTORY: 1. Cardiac stent.  2. CABG.  3. Abdominal hernia.  4. Cholecystectomy.  5. Back surgery.   HOME MEDICATIONS:  1. Tylenol 650 every 4 hours as needed.  2. Lisinopril 10 mg oral daily.  3. Maalox as needed. 4. Zoloft 50 mg oral daily.  5. Glucophage XR 500 mg, 2 tablets 2 times a day.  6. Loperamide as needed.  7. Zofran as needed.  8. Simvastatin 40 mg at bedtime.  9. Aspirin 81 mg oral daily.  10. Plavix 75 mg oral daily.  11. Toprol-XL 50 mg extended release oral daily.  12. Simethicone 180 mg oral 2 times a day as needed for indigestion. 13. Protonix 40 mg oral daily.  14. Levothyroxine 100 mcg oral daily.  15. Vitamin D3 1000 international units oral daily.    ALLERGIES: AMBIEN.   FAMILY HISTORY: Significant for diabetes.   SOCIAL HISTORY: Currently none. Currently no smoking. No alcohol.   PHYSICAL EXAMINATION: VITAL SIGNS: Temperature 98.4, pulse 77, respiratory rate 18, blood pressure 138/76, saturating 97% on room air.  GENERAL: Well-nourished male, looks comfortable in bed in no apparent distress.  HEENT: Head atraumatic, normocephalic. Pupils are equal, reactive to light. Pink conjunctivae. Anicteric sclerae. Wearing glasses. Moist oral mucosa.  NECK: Supple. No thyromegaly. No JVD.  CHEST: Good air entry bilaterally. No wheezing, rales, rhonchi.  CARDIOVASCULAR: S1, S2 heard. No rubs, murmur, or gallops.  ABDOMEN: Soft, nontender, nondistended. Bowel sounds present.  EXTREMITIES: No edema. No clubbing. No cyanosis.  PSYCHIATRIC: Awake, alert  x 3. Intact judgment and insight.  SKIN: Normal skin turgor, warm and dry.  NEUROLOGICAL: Cranial nerve exam: The patient has old left homonymous hemianopsia and has right eye exotropia. As well, has vertical and horizontal  nystagmus noticed. Motor strength 4 to 5 out of 5 in the right upper extremity and lower extremity, 5 out of 5 in the left upper and lower extremities.   PERTINENT LABORATORY, DIAGNOSTIC AND RADIOLOGICAL DATA:  Glucose 91, BUN 26, creatinine 1.04, sodium 144, potassium 4.6, chloride 110, CO2 26. Total CK 276. CK-MB 2.6. Troponin 0.06. White blood cells 10.3, hemoglobin 12.1, hematocrit 36.9, platelets 176. Urinalysis negative for nitrite and leukocyte esterase.   CT of brain does not show any acute finding, old ischemic changes.   ASSESSMENT AND PLAN: This is a 74 year old male who presents with dizziness and falls, known to have history of posterior cerebral artery CVA.   1. Dizziness: The patient denies any loss of consciousness, unlikely to be due to syncope. As well, he denies any preceding lightheadedness. The patient had nystagmus on the physical exam, has known history of CVA; so, we will admit to rule out CVA/TIA. CT of the brain does not show any acute finding. The patient was given 324 of aspirin. We will continue him on aspirin, statin and Plavix. We will check MRI of the brain, bilateral carotid Dopplers and echo and will consult Neurology service. We will hold his hypertension medications and will allow for systolic blood pressure between 140 to 180.  2. Borderline troponin: The patient as well has some T wave inversion in a couple of the leads but denies any chest pain, any shortness of breath. We will continue him on aspirin, Plavix and statin. We will continue to cycle troponins. We will have him on telemetry monitor.  3. Coronary artery disease: We will continue aspirin, Plavix, statin. We will resume his beta blockers and ACE inhibitors when CVA is ruled out.   4. Diabetes mellitus: Continue with insulin sliding scale.  5. Hypertension, acceptable: We will hold his meds to allow blood pressure to be around 180 for the initial 24 hours due to possible TIA.  6. Hyperlipidemia: Continue with statin.  7. Hypothyroidism:  Continue with Synthroid.  8. Deep vein thrombosis prophylaxis: Subcutaneous heparin.  9. GI prophylaxis: On Protonix.   CODE STATUS:  FULL CODE.     TOTAL TIME SPENT ON ADMISSION AND PATIENT CARE: 60 minutes.   ____________________________ Starleen Arms, MD dse:cb D: 01/28/2013 03:30:53 ET T: 01/28/2013 10:44:59 ET JOB#: [id number]  cc: Starleen Arms, MD, <Dictator> Ghalia Reicks Teena Irani MD ELECTRONICALLY SIGNED 01/31/2013 5:10

## 2015-04-16 NOTE — Consult Note (Signed)
PATIENT NAME:  Samuel Oneill, Samuel Oneill MR#:  [id number] DATE OF BIRTH:  10-11-1941  DATE OF CONSULTATION:  01/28/2013  REFERRING PHYSICIAN:   CONSULTING PHYSICIAN:  Samuel BrownsYuriy Wanna Gully, MD  REASON FOR NEUROLOGICAL EVALUATION: Ataxia, gait instability and falls.   HISTORY OF PRESENT ILLNESS: This is a pleasant 74 year old gentleman with past medical history of prior ischemic stroke noted on the CAT scan, past history of hypertension, type 2 diabetes, hyperlipidemia, coronary artery disease, the patient states that he had stents in 2007, cardiomyopathy, presenting with episode of dizziness and falls. The patient states that he lives in an apartment with his friend. The patient states that in the morning, he tried to get up out of bed, felt unsteady, felt all his muscles were stiff, felt dizzy and fell. He denies any loss of consciousness. There was no seizure-like activity. No tongue biting, no urinary incontinence. He needed assistance to get up, and his roommate called EMS. The patient states the dizziness and gait instability and falls have started in the past summer and have been going on for the past 6 to 7 months. Most of the dizziness is associated at the time when the patient stands up from a lying position. At home, he states he ambulates with a walker. He denies any chest pain or shortness of breath, any palpitation, nausea or any new focal deficits, including weakness, numbness or any other sensory changes. Currently, while talking to him, the patient states his gait instability has improved through his stay in the hospital.   PAST MEDICAL HISTORY: Significant for right PCA infarct, multiple basal ganglia infarcts. He has a left homonymous hemianopsia, hypertension. He has history of headaches, type 2 diabetes, hyperlipidemia, history of MI, status post CABG in 2007, hypothyroidism and cardiomyopathy.   HOME MEDICATIONS: Include Tylenol, lisinopril, Maalox, Zoloft, Glucophage, Zofran, simvastatin,  aspirin of 81, Plavix 75, Toprol-XL, simethicone, Protonix, levothyroxine and vitamin D3.   ALLERGIES: INCLUDE AMBIEN.  FAMILY HISTORY: Significant for diabetes.   SOCIAL HISTORY: Does not smoke, does not drink. No drug use   DIAGNOSTIC STUDIES: The patient's imaging reviewed, including CT head which showed small vessel chronic ischemic disease, bilateral basal ganglia infarcts and right upper lobe infarct consistent right PCA infarct. No acute intracranial abnormality is noted   PHYSICAL EXAMINATION:  VITAL SIGNS: Include a temperature of 98.4, pulse of 77, respirations 18, blood pressure 138/76, pulse oximetry is 97% and that is at room air.  NEUROLOGICAL EXAMINATION: The patient is alert, awake, oriented to place, time, location and date. He is able to tell me the president of [name]. Attention is intact. Cranial nerve examination: Extraocular movements appear to be limited, specifically on the right eye. He has right exotropia and nystagmus that has been reported prior, has subsided significantly and is very minimal. Visual fields: Left homonymous hemianopsia is present, and that is chronic. Pupils are 4 mm and sluggish to 3 mm bilaterally. Questionable left facial droop that is chronic on the left side. Facial sensation intact. Tongue is midline. Uvula elevates symmetrically. Shoulder shrug is symmetrical bilaterally. Motor examination: 4+ in the left upper extremity and left lower extremity, 4 in the right upper extremity and 4- in the right lower extremity. The weakness in the right side, as per patient, is chronic. There appears to be minor decreased sensation to light touch in the right upper and right lower extremity. Again, as per patient, that is chronic since about 2007. There is also weak dorsi- and plantarflexion on the right side.  Slightly brisk reflexes of 2+ compared to 1+ that is seen throughout. The 2+ is seen in the right patella, possibly consistent with old ischemia.  Gait was assessed. The is leanining to his right side. Gait is slow based, and he needs assistance with turns.   IMPRESSION: A 74 year old male with multiple vascular risk factors presents with a 6- to 79-month history of dizziness and presented to [facility] with acute-onset dizziness status post fall. The patient has fallen about 6 to 7 times in the past 6 months, all of them without loss of consciousness, and he needed assistance in getting up from a seated position. At this time, no reported head trauma. CT of the head as above, no acute intracranial pathology.   PLAN:  1. Dizziness. At this point, there are no new focal neurological deficits, not convinced that this is new ischemia, but at the same time, on examination, the patient is leaning more towards the right side and more so unstable on that side. I suspect it to be chronic, but the same time, I agree with obtaining MRI of the brain to look at the cerebellum to make sure there is no new ischemia there that the CAT scan did not pick up. If possible, would look at his posterior circulation, specifically MRA of head and neck if possible to look at the vertebrals and basilar artery to make sure there is no significant stenosis there that could contribute to vertebrobasilar insufficiency, ataxia and his dizziness. Agree with echocardiogram and Dopplers. If we are considering the stroke in terms of blood pressure control, would allow the blood pressure to ride up to 220/110 and decrease it to under 180 systolic tomorrow. It is noted that the patient is on aspirin and Plavix at home, aspirin 81 mg, Plavix of 75. Aspirin 81 was increased to 325 in the hospital; that is because of the troponin leak. At this time, will continue trending troponin. If there is no suspicion of worsening coronary artery disease, for stroke prevention, there has not been proven of Plavix and aspirin benefit, and the recent studies have actually been showing  aspirin and Plavix has an increased chance of hemorrhage when compared to stroke prevention. Would discuss with cardiology. If dual antiplatelet is not needed for his coronary stenting, would continue Plavix 75 at home, but if it is needed, will continue the same aspirin 81 and Plavix 75 at home. Continue statin as well.  2. The rest of the workup as per primary team.   Thank you, it was a pleasure seeing this patient.     ____________________________ Samuel Browns, MD yz:OSi D: 01/28/2013 11:53:22 ET T: 01/28/2013 12:16:15 ET JOB#: [id number]  cc: Samuel Browns, MD, <Dictator> Samuel Browns MD ELECTRONICALLY SIGNED 02/02/2013 18:11

## 2015-04-17 NOTE — Discharge Summary (Signed)
PATIENT NAME:  Samuel Oneill, Samuel Oneill MR#:  [id number] DATE OF BIRTH:  07/09/1941  DATE OF ADMISSION:  03/09/2014 DATE OF DISCHARGE:  03/11/2014  DISPOSITION: Discharged to skilled nursing facility.   DISCHARGE DIAGNOSES: 1.  Orthostatic syncope.  2.  Acute renal failure.  3.  Dehydration.  4.  Chronic systolic congestive heart failure.  5.  Hypertension.  6.  Chronic vertigo. 7.  History of cerebrovascular accident.  8.  Gait abnormalities.   IMAGING STUDIES: Include:  1.  A CT scan of the head without contrast shows encephalomalacia, prior strokes. No acute changes.  2.  Chest, PA and lateral, showed no infiltrate, pleural effusion or pneumothorax.   ADMITTING HISTORY AND PHYSICAL: Please see detailed H and P dictated by Dr. Luberta Mutter. In brief, a 74 year-old male patient with history of prior vertigo and syncopal episodes, presented to the hospital complaining of syncope which happens while he stands up. The patient feels dizzy, lightheaded on changing his position to standing up, generally takes a few steps and tends to pass out. This time this occurred at rest and the patient presented to the Emergency Room. He was found to be hypotensive with orthostatic vitals and admitted to the hospitalist service along with acute renal failure.   HOSPITAL COURSE: 1.  Orthostatic syncope with acute renal failure. The patient was found to be severely dehydrated, taking Lasix at home. He was given IV fluid resuscitation with which he improved well although his blood pressure does tend to drop when he stands up. He mentions that this has been a chronic problem. He does wear compression stockings at home, which will be continued. I will be stopping his Lasix, which has caused significant dehydration. He has no signs of fluid overload, not short of breath, no edema. Acute renal failure has resolved. The patient has been given instructions to take time from lying down to sitting and sitting to standing up  positions to prevent any future falls. He will need to use his walker at all times. The patient's cortisol level was checked which is normal. TSH is normal.  2.  Hypertension. Continue home medications.  3.  Chronic vertigo contributing to his balance problems and fall and dizziness.  4.  Chronic systolic CHF, EF of 40%, stable.   PHYSICAL EXAMINATION:   VITAL SIGNS:  Prior to discharge, the patient's temperature was 98, pulse 66, blood pressure 142/63, saturating 99% on room air with no edema on examination.  CARDIAC EXAMINATION: Shows S1, S2, without any murmurs.  LUNGS: Sound clear.  NEURO:  Motor strength is 5/5 in lower extremities and about 4/5 in right upper extremity, 5/5 in the left upper extremity.   DISCHARGE MEDICATIONS: 1.  Plavix 75 mg daily.  2.  Simvastatin 40 mg daily.  3.  Toprol-XL 50 mg daily.  4.  Levothyroxine 100 mcg daily.  5.  Protonix 40 mg daily.  6.  Vitamin D3, 1000 international units daily.  7.  Zoloft 50 mg 1.5 tablets oral once a day.  8.  Metformin 500 mg 2 tablets oral once a day.  9.  Meclizine 25 mg oral every 8 hours as needed.  10.  Lasix has been held.   DISCHARGE INSTRUCTIONS: Low-sodium, regular-consistency diet. Activity as tolerated using a walker with assistance. The patient will need compression stockings for orthostatic hypotension. The patient may need Lasix in the future if there are any signs of fluid overload.   TIME SPENT TODAY ON DISCHARGE ACTIVITY: 45 minutes.  ____________________________ Molinda BailiffSrikar R. Dezarae Mcclaran, MD srs:cs D: 03/11/2014 14:07:39 ET T: 03/11/2014 14:59:29 ET JOB#: [id number]  cc: Wardell HeathSrikar R. Nicole Defino, MD, <Dictator> Kristopher OppenheimJane D. Shawn StallHollingsworth, MD Orie FishermanSRIKAR R Eleena Grater MD ELECTRONICALLY SIGNED 03/21/2014 10:25

## 2015-04-17 NOTE — H&P (Signed)
PATIENT NAME:  Samuel Oneill, Theotis C MR#:  [id number] DATE OF BIRTH:  May 27, 1941  DATE OF ADMISSION:  03/09/2014  PRIMARY CARE PHYSICIAN: Dr. Benetta [facility]Holl[facility](  EMERGENCY ROOM PHYSICIAN: Dr. Carollee MassedKaminski.   CHIEF COMPLAINT: Syncope.   HISTORY OF PRESENT ILLNESS: The patient is a 74 year old male patient [facility] who comes in because of syncope. The patient brought in by the EMS because of syncopal episode. The patient also found to have low blood pressure of 88/50 and O2 saturation was 85% on room air when he came. The patient was seen at bedside, says that he felt dizzy after he had lunch. He felt dizzy and his knees gave out. The patient feels like he lost consciousness briefly for a few seconds. Did not have any headache. No blurred vision. The patient denies any chest pain. No weakness in hands or legs. The patient has history of left hemianopia and also previous stroke. He does not have any new symptoms.   PAST MEDICAL HISTORY: Significant for hypertension, diabetes and history of chronic systolic heart failure with EF of 35% to 40%. Admitted in August last year, discharged on August 20. The patient has history of hypothyroidism and diabetes and history of CVA before with left facial droop, and coronary artery disease. Also includes history of chronic anemia and positional vertigo and hyperlipidemia.   PAST SURGICAL HISTORY: Significant for bypass surgery, appendectomy, gallbladder surgery and history of cardiac stent placement. Also had a history of hernia repair.   SOCIAL HISTORY: No smoking. No drinking. Lives with his girlfriend.   FAMILY HISTORY: Significant for diabetes.   ALLERGIES: AMBIEN.   HOME MEDICATIONS:  1.  Lasix 40 mg p.o. daily.  2.  Potassium chloride 20 mEq p.o. daily.  3.  Levothyroxine 100 mcg p.o. daily.  4.  Lisinopril 10 mg p.o. b.i.d. 5.  Loperamide  2 mg every four hours as needed for diarrhea.  6.  Meclizine 25 mg every 8 hours  as needed.  7.  Metformin 500 mg 2 tablets once a day.  8.  Nitroglycerin sublingual p.r.n. for chest pain.  9.  Plavix 75 mg p.o. daily.  10.  Simvastatin 40 mg p.o. daily.  11.  Protonix 40 mg p.o. daily.  12.  Toprol-XL 50 mg p.o. daily.  13.  Vitamin D3 at 1000 international units once a day.  14.  Zoloft 50 mg one and a half tablets once a day.   [facility]   REVIEW OF SYSTEMS:  CONSTITUTIONAL: Has no fever. No fatigue. No weakness.  EYES: No blurred vision. Does have history of left homonymous hemianopsia.  ENT: No tinnitus. No epistaxis. No difficulty swallowing.  RESPIRATORY: No cough. No wheezing.  CARDIOVASCULAR: No chest pain. No orthopnea. No PND.  GASTROINTESTINAL: No nausea or vomiting. No abdominal pain. No diarrhea. Complains of incontinence of urine and bowels.  ENDOCRINE: Has history of diabetes but denies any polyuria or nocturia.  INTEGUMENT: No skin rashes.  MUSCULOSKELETAL: No joint pains.  NEUROLOGIC: Had history of stroke before.  PSYCHIATRIC: No anxiety or insomnia.   PHYSICAL EXAMINATION:  VITAL SIGNS: Temperature 98.7, heart 68, blood pressure initially 88/50, and now with 147/59. Oxygen saturations were 88% when brought in by EMS, now is 98% on room air.  GENERAL: An elderly male not in distress, answering questions appropriately, oriented to time, place, person.  HEENT: Head normocephalic, atraumatic.  EYES: Pupils equally reacting to light. Extraocular movements are intact. The patient does have left homonymous hemianopia.  ENT: No tympanic membrane congestion. No turbinate hypertrophy. No oropharyngeal erythema.  NECK: Supple. No JVD. No carotid bruits. The patient has normal range of motion. No thyroid enlargement.  CARDIOVASCULAR: S1, S2 regular. PMI is not displaced. Peripheral pulses are equal in dorsalis pedis and femoral artery.  LUNGS: Clear to auscultation. No  wheeze. No rales. Not using accessory muscles of respiration.  ABDOMEN: Soft, nontender, nondistended. Bowel sounds present. No organomegaly. No hernias.  EXTREMITIES: No extremity edema. No cyanosis. No clubbing. Able to move extremities x4.  NEUROLOGIC: Alert, awake, oriented,  Power 5 /5 in upper and lower extremities. Senses current intact. Deep tendon reflexes 2+ bilaterally. Cranial nerves intact except visual field defects in the left eye.  PSYCHIATRIC: Motor and affect are within normal limits.   LABORATORY DATA: WBC 9, hemoglobin 10.9, hematocrit 33.6, platelets 186.   ELECTROLYTES: Sodium 139, potassium 4, chloride 106, bicarb 30, BUN 36, creatinine 1.52, glucose 158.   Head CT shows likely intracranial abnormality, encephalomalacia with old medial right parietal lobe infarct and multifocal old bilateral basal ganglion infarcts. Mild cerebral and cerebellar atrophy with chronic microvascular ischemic changes throughout the cerebral white matter.   Chest x-ray shows no active cardiopulmonary disease. Lungs are well inflated and clear.   EKG: Normal sinus rhythm at 68 beats per minute. No ST-T changes.   ASSESSMENT AND PLAN:  1.  This is a 74 year old male patient with syncopal episode likely secondary to hypotension and acute renal failure. The patient will admitted to observation status. CT head unremarkable except the old strokes. We will do neurologic checks for next 24 hours and continue IV fluids. Hold his Lasix and lisinopril and see how he does.  2.  Diabetes mellitus, type 2. Continue sliding scale with coverage and metformin.  3.  History of coronary artery disease with stent placement. Continue aspirin and Plavix and statins.  4.  Acute renal failure, normal creatinine in August of last year. Acute renal failure likely due to hypotension  with acute tubular necrosis. The patient's Lasix and lisinopril will be held at this time.  5.  Hypothyroidism. Continue levothyroxine 100  mcg p.o. daily.  6.  History of positional vertigo. Continue meclizine.  7.  Gastroesophageal reflux disease. Continue proton pump inhibitors.   TIME SPENT: About 50 minutes on this patient.   ____________________________ Katha Hamming, MD sk:np D: 03/09/2014 16:34:12 ET T: 03/09/2014 17:42:03 ET JOB#: [id number]  cc: Katha Hamming, MD, <Dictator> Katha Hamming MD ELECTRONICALLY SIGNED 03/11/2014 13:55

## 2015-09-19 ENCOUNTER — Emergency Department: Payer: Medicare (Managed Care)

## 2015-09-19 ENCOUNTER — Encounter: Payer: Self-pay | Admitting: Emergency Medicine

## 2015-09-19 ENCOUNTER — Inpatient Hospital Stay
Admission: EM | Admit: 2015-09-19 | Discharge: 2015-09-24 | DRG: 871 | Disposition: A | Discharge status: Skilled Nursing Facility | Payer: Medicare (Managed Care) | Attending: Internal Medicine | Admitting: Internal Medicine

## 2015-09-19 DIAGNOSIS — I2510 Atherosclerotic heart disease of native coronary artery without angina pectoris: Secondary | ICD-10-CM | POA: Diagnosis present

## 2015-09-19 DIAGNOSIS — G9341 Metabolic encephalopathy: Secondary | ICD-10-CM | POA: Diagnosis present

## 2015-09-19 DIAGNOSIS — I248 Other forms of acute ischemic heart disease: Secondary | ICD-10-CM | POA: Diagnosis present

## 2015-09-19 DIAGNOSIS — A419 Sepsis, unspecified organism: Principal | ICD-10-CM | POA: Diagnosis present

## 2015-09-19 DIAGNOSIS — Z7902 Long term (current) use of antithrombotics/antiplatelets: Secondary | ICD-10-CM

## 2015-09-19 DIAGNOSIS — D509 Iron deficiency anemia, unspecified: Secondary | ICD-10-CM | POA: Diagnosis present

## 2015-09-19 DIAGNOSIS — I255 Ischemic cardiomyopathy: Secondary | ICD-10-CM | POA: Insufficient documentation

## 2015-09-19 DIAGNOSIS — R778 Other specified abnormalities of plasma proteins: Secondary | ICD-10-CM

## 2015-09-19 DIAGNOSIS — Z66 Do not resuscitate: Secondary | ICD-10-CM | POA: Diagnosis present

## 2015-09-19 DIAGNOSIS — R0600 Dyspnea, unspecified: Secondary | ICD-10-CM | POA: Insufficient documentation

## 2015-09-19 DIAGNOSIS — E039 Hypothyroidism, unspecified: Secondary | ICD-10-CM | POA: Diagnosis present

## 2015-09-19 DIAGNOSIS — R7989 Other specified abnormal findings of blood chemistry: Secondary | ICD-10-CM

## 2015-09-19 DIAGNOSIS — I4891 Unspecified atrial fibrillation: Secondary | ICD-10-CM | POA: Diagnosis present

## 2015-09-19 DIAGNOSIS — J9601 Acute respiratory failure with hypoxia: Secondary | ICD-10-CM | POA: Insufficient documentation

## 2015-09-19 DIAGNOSIS — K219 Gastro-esophageal reflux disease without esophagitis: Secondary | ICD-10-CM | POA: Diagnosis present

## 2015-09-19 DIAGNOSIS — Z79899 Other long term (current) drug therapy: Secondary | ICD-10-CM | POA: Diagnosis not present

## 2015-09-19 DIAGNOSIS — R6520 Severe sepsis without septic shock: Secondary | ICD-10-CM | POA: Diagnosis not present

## 2015-09-19 DIAGNOSIS — Z888 Allergy status to other drugs, medicaments and biological substances status: Secondary | ICD-10-CM

## 2015-09-19 DIAGNOSIS — L89152 Pressure ulcer of sacral region, stage 2: Secondary | ICD-10-CM | POA: Diagnosis present

## 2015-09-19 DIAGNOSIS — E872 Acidosis: Secondary | ICD-10-CM | POA: Diagnosis present

## 2015-09-19 DIAGNOSIS — I350 Nonrheumatic aortic (valve) stenosis: Secondary | ICD-10-CM | POA: Diagnosis not present

## 2015-09-19 DIAGNOSIS — E559 Vitamin D deficiency, unspecified: Secondary | ICD-10-CM | POA: Diagnosis present

## 2015-09-19 DIAGNOSIS — Z951 Presence of aortocoronary bypass graft: Secondary | ICD-10-CM | POA: Diagnosis not present

## 2015-09-19 DIAGNOSIS — K5900 Constipation, unspecified: Secondary | ICD-10-CM | POA: Diagnosis present

## 2015-09-19 DIAGNOSIS — Z87891 Personal history of nicotine dependence: Secondary | ICD-10-CM

## 2015-09-19 DIAGNOSIS — I5023 Acute on chronic systolic (congestive) heart failure: Secondary | ICD-10-CM | POA: Insufficient documentation

## 2015-09-19 DIAGNOSIS — L8990 Pressure ulcer of unspecified site, unspecified stage: Secondary | ICD-10-CM | POA: Insufficient documentation

## 2015-09-19 DIAGNOSIS — I10 Essential (primary) hypertension: Secondary | ICD-10-CM | POA: Diagnosis present

## 2015-09-19 DIAGNOSIS — N179 Acute kidney failure, unspecified: Secondary | ICD-10-CM | POA: Diagnosis present

## 2015-09-19 DIAGNOSIS — Y95 Nosocomial condition: Secondary | ICD-10-CM | POA: Diagnosis present

## 2015-09-19 DIAGNOSIS — I69351 Hemiplegia and hemiparesis following cerebral infarction affecting right dominant side: Secondary | ICD-10-CM

## 2015-09-19 DIAGNOSIS — Z9861 Coronary angioplasty status: Secondary | ICD-10-CM | POA: Diagnosis not present

## 2015-09-19 DIAGNOSIS — E119 Type 2 diabetes mellitus without complications: Secondary | ICD-10-CM | POA: Diagnosis present

## 2015-09-19 DIAGNOSIS — E876 Hypokalemia: Secondary | ICD-10-CM | POA: Diagnosis present

## 2015-09-19 DIAGNOSIS — J189 Pneumonia, unspecified organism: Secondary | ICD-10-CM | POA: Diagnosis present

## 2015-09-19 DIAGNOSIS — E785 Hyperlipidemia, unspecified: Secondary | ICD-10-CM | POA: Diagnosis present

## 2015-09-19 HISTORY — DX: Nicotine dependence, unspecified, uncomplicated: F17.200

## 2015-09-19 HISTORY — DX: Atherosclerotic heart disease of native coronary artery without angina pectoris: I25.10

## 2015-09-19 HISTORY — DX: Dysphagia following unspecified cerebrovascular disease: I69.991

## 2015-09-19 HISTORY — DX: Iron deficiency anemia, unspecified: D50.9

## 2015-09-19 HISTORY — DX: Vitamin D deficiency, unspecified: E55.9

## 2015-09-19 HISTORY — DX: Cerebrovascular disease, unspecified: I67.9

## 2015-09-19 HISTORY — DX: Cerebral infarction, unspecified: I63.9

## 2015-09-19 HISTORY — DX: Weakness: R53.1

## 2015-09-19 HISTORY — DX: Constipation, unspecified: K59.00

## 2015-09-19 HISTORY — DX: Gastro-esophageal reflux disease without esophagitis: K21.9

## 2015-09-19 HISTORY — DX: Hypothyroidism, unspecified: E03.9

## 2015-09-19 HISTORY — DX: Ischemic cardiomyopathy: I25.5

## 2015-09-19 HISTORY — DX: Hemiplegia, unspecified affecting unspecified side: G81.90

## 2015-09-19 HISTORY — DX: Chronic systolic (congestive) heart failure: I50.22

## 2015-09-19 LAB — CBC
HCT: 33.8 % — ABNORMAL LOW (ref 40.0–52.0)
Hemoglobin: 10.5 g/dL — ABNORMAL LOW (ref 13.0–18.0)
MCH: 29.7 pg (ref 26.0–34.0)
MCHC: 31 g/dL — ABNORMAL LOW (ref 32.0–36.0)
MCV: 95.7 fL (ref 80.0–100.0)
Platelets: 272 10*3/uL (ref 150–440)
RBC: 3.53 MIL/uL — AB (ref 4.40–5.90)
RDW: 17.7 % — AB (ref 11.5–14.5)
WBC: 15.7 10*3/uL — AB (ref 3.8–10.6)

## 2015-09-19 LAB — URINALYSIS COMPLETE WITH MICROSCOPIC (ARMC ONLY)
BILIRUBIN URINE: NEGATIVE
GLUCOSE, UA: NEGATIVE mg/dL
HGB URINE DIPSTICK: NEGATIVE
Ketones, ur: NEGATIVE mg/dL
Leukocytes, UA: NEGATIVE
Nitrite: NEGATIVE
Protein, ur: 100 mg/dL — AB
Specific Gravity, Urine: 1.019 (ref 1.005–1.030)
pH: 5 (ref 5.0–8.0)

## 2015-09-19 LAB — COMPREHENSIVE METABOLIC PANEL
ALBUMIN: 3.3 g/dL — AB (ref 3.5–5.0)
ALT: 9 U/L — AB (ref 17–63)
AST: 33 U/L (ref 15–41)
Alkaline Phosphatase: 78 U/L (ref 38–126)
Anion gap: 16 — ABNORMAL HIGH (ref 5–15)
BUN: 16 mg/dL (ref 6–20)
CHLORIDE: 105 mmol/L (ref 101–111)
CO2: 21 mmol/L — AB (ref 22–32)
CREATININE: 1.46 mg/dL — AB (ref 0.61–1.24)
Calcium: 8.4 mg/dL — ABNORMAL LOW (ref 8.9–10.3)
GFR calc Af Amer: 52 mL/min — ABNORMAL LOW (ref >60)
GFR, EST NON AFRICAN AMERICAN: 45 mL/min — AB (ref >60)
Glucose, Bld: 212 mg/dL — ABNORMAL HIGH (ref 65–99)
POTASSIUM: 3.9 mmol/L (ref 3.5–5.1)
SODIUM: 142 mmol/L (ref 135–145)
Total Bilirubin: 1.2 mg/dL (ref 0.3–1.2)
Total Protein: 6.9 g/dL (ref 6.5–8.1)

## 2015-09-19 LAB — BLOOD GAS, ARTERIAL
Acid-base deficit: 6.2 mmol/L — ABNORMAL HIGH (ref 0.0–2.0)
BICARBONATE: 20.7 meq/L — AB (ref 21.0–28.0)
Delivery systems: POSITIVE
EXPIRATORY PAP: 6
FIO2: 0.8
Inspiratory PAP: 14
Mechanical Rate: 12
O2 SAT: 99.8 %
Patient temperature: 37
pCO2 arterial: 45 mmHg (ref 32.0–48.0)
pH, Arterial: 7.27 — ABNORMAL LOW (ref 7.350–7.450)
pO2, Arterial: 257 mmHg — ABNORMAL HIGH (ref 83.0–108.0)

## 2015-09-19 LAB — PROTIME-INR
INR: 1.34
Prothrombin Time: 16.8 seconds — ABNORMAL HIGH (ref 11.4–15.0)

## 2015-09-19 LAB — BRAIN NATRIURETIC PEPTIDE

## 2015-09-19 LAB — TROPONIN I: TROPONIN I: 0.07 ng/mL — AB (ref <0.031)

## 2015-09-19 LAB — LACTIC ACID, PLASMA: Lactic Acid, Venous: 7.2 mmol/L (ref 0.5–2.0)

## 2015-09-19 LAB — APTT: aPTT: 29 seconds (ref 24–36)

## 2015-09-19 MED ORDER — SODIUM CHLORIDE 0.9 % IV SOLN
INTRAVENOUS | Status: DC
  Administered 2015-09-19 – 2015-09-20 (×2): via INTRAVENOUS

## 2015-09-19 MED ORDER — LEVOTHYROXINE SODIUM 100 MCG PO TABS
100.0000 ug | ORAL_TABLET | Freq: Every day | ORAL | Status: DC
  Administered 2015-09-21 – 2015-09-24 (×4): 100 ug via ORAL
  Filled 2015-09-19 (×5): qty 1

## 2015-09-19 MED ORDER — VANCOMYCIN HCL 10 G IV SOLR
1250.0000 mg | INTRAVENOUS | Status: DC
  Administered 2015-09-20 – 2015-09-22 (×4): 1250 mg via INTRAVENOUS
  Filled 2015-09-19 (×5): qty 1250

## 2015-09-19 MED ORDER — MORPHINE SULFATE (PF) 2 MG/ML IV SOLN: 2.0000 mg | INTRAVENOUS | Status: DC | PRN

## 2015-09-19 MED ORDER — VANCOMYCIN HCL IN DEXTROSE 1-5 GM/200ML-% IV SOLN
1000.0000 mg | Freq: Once | INTRAVENOUS | Status: AC
  Administered 2015-09-19: 1000 mg via INTRAVENOUS
  Filled 2015-09-19: qty 200

## 2015-09-19 MED ORDER — HEPARIN SODIUM (PORCINE) 5000 UNIT/ML IJ SOLN
5000.0000 [IU] | Freq: Three times a day (TID) | INTRAMUSCULAR | Status: DC
  Administered 2015-09-19 – 2015-09-21 (×5): 5000 [IU] via SUBCUTANEOUS
  Filled 2015-09-19 (×5): qty 1

## 2015-09-19 MED ORDER — SODIUM CHLORIDE 0.9 % IJ SOLN
3.0000 mL | Freq: Two times a day (BID) | INTRAMUSCULAR | Status: DC
  Administered 2015-09-19 – 2015-09-20 (×3): 3 mL via INTRAVENOUS

## 2015-09-19 MED ORDER — ZINC OXIDE 20 % EX OINT
1.0000 | TOPICAL_OINTMENT | CUTANEOUS | Status: DC
Start: 2015-09-19 — End: 2015-09-24
  Administered 2015-09-20 – 2015-09-22 (×3): 1 via TOPICAL
  Filled 2015-09-19: qty 28.35

## 2015-09-19 MED ORDER — ATORVASTATIN CALCIUM 20 MG PO TABS
40.0000 mg | ORAL_TABLET | Freq: Every day | ORAL | Status: DC
  Administered 2015-09-21 – 2015-09-24 (×4): 40 mg via ORAL
  Filled 2015-09-19 (×5): qty 2

## 2015-09-19 MED ORDER — SERTRALINE HCL 50 MG PO TABS
100.0000 mg | ORAL_TABLET | Freq: Every day | ORAL | Status: DC
  Administered 2015-09-21 – 2015-09-24 (×4): 100 mg via ORAL
  Filled 2015-09-19 (×5): qty 2

## 2015-09-19 MED ORDER — SODIUM CHLORIDE 0.9 % IV SOLN
1000.0000 mL | Freq: Once | INTRAVENOUS | Status: AC
  Administered 2015-09-19: 1000 mL via INTRAVENOUS

## 2015-09-19 MED ORDER — OXYCODONE HCL 5 MG PO TABS: 5.0000 mg | ORAL_TABLET | ORAL | Status: DC | PRN

## 2015-09-19 MED ORDER — LORAZEPAM 2 MG/ML IJ SOLN
INTRAMUSCULAR | Status: AC
  Administered 2015-09-19: 0.5 mg via INTRAVENOUS
  Filled 2015-09-19: qty 1

## 2015-09-19 MED ORDER — DOCUSATE SODIUM 100 MG PO CAPS: 200.0000 mg | ORAL_CAPSULE | Freq: Every day | ORAL | Status: DC | PRN

## 2015-09-19 MED ORDER — ONDANSETRON HCL 4 MG/2ML IJ SOLN: 4.0000 mg | Freq: Four times a day (QID) | INTRAMUSCULAR | Status: DC | PRN

## 2015-09-19 MED ORDER — LORAZEPAM 2 MG/ML IJ SOLN
0.5000 mg | Freq: Once | INTRAMUSCULAR | Status: AC
  Administered 2015-09-19: 0.5 mg via INTRAVENOUS

## 2015-09-19 MED ORDER — PIPERACILLIN-TAZOBACTAM 3.375 G IVPB
3.3750 g | Freq: Three times a day (TID) | INTRAVENOUS | Status: DC
  Administered 2015-09-20 – 2015-09-22 (×8): 3.375 g via INTRAVENOUS
  Filled 2015-09-19 (×13): qty 50

## 2015-09-19 MED ORDER — CAMPHOR-MENTHOL 0.5-0.5 % EX LOTN
1.0000 "application " | TOPICAL_LOTION | Freq: Two times a day (BID) | CUTANEOUS | Status: DC
  Administered 2015-09-20 – 2015-09-23 (×5): 1 via TOPICAL
  Filled 2015-09-19: qty 222

## 2015-09-19 MED ORDER — ACETAMINOPHEN 325 MG PO TABS: 650.0000 mg | ORAL_TABLET | Freq: Four times a day (QID) | ORAL | Status: DC | PRN

## 2015-09-19 MED ORDER — IPRATROPIUM-ALBUTEROL 0.5-2.5 (3) MG/3ML IN SOLN
3.0000 mL | RESPIRATORY_TRACT | Status: DC
  Administered 2015-09-19 – 2015-09-21 (×12): 3 mL via RESPIRATORY_TRACT
  Filled 2015-09-19 (×12): qty 3

## 2015-09-19 MED ORDER — VITAMINS A & D EX OINT: 1.0000 "application " | TOPICAL_OINTMENT | CUTANEOUS | Status: DC

## 2015-09-19 MED ORDER — SODIUM CHLORIDE 0.9 % IV SOLN: 1000.0000 mL | Freq: Once | INTRAVENOUS | Status: DC

## 2015-09-19 MED ORDER — ACETAMINOPHEN 650 MG RE SUPP
650.0000 mg | Freq: Four times a day (QID) | RECTAL | Status: DC | PRN
  Administered 2015-09-19: 650 mg via RECTAL
  Filled 2015-09-19: qty 1

## 2015-09-19 MED ORDER — VITAMIN D 1000 UNITS PO TABS
1000.0000 [IU] | ORAL_TABLET | Freq: Every day | ORAL | Status: DC
  Administered 2015-09-21 – 2015-09-24 (×4): 1000 [IU] via ORAL
  Filled 2015-09-19 (×5): qty 1

## 2015-09-19 MED ORDER — ONDANSETRON HCL 4 MG PO TABS: 4.0000 mg | ORAL_TABLET | Freq: Four times a day (QID) | ORAL | Status: DC | PRN

## 2015-09-19 MED ORDER — SODIUM CHLORIDE 0.9 % IV BOLUS (SEPSIS)
1000.0000 mL | INTRAVENOUS | Status: AC
  Administered 2015-09-19 (×3): 1000 mL via INTRAVENOUS

## 2015-09-19 MED ORDER — CLOPIDOGREL BISULFATE 75 MG PO TABS
75.0000 mg | ORAL_TABLET | Freq: Every day | ORAL | Status: DC
  Administered 2015-09-21 – 2015-09-24 (×4): 75 mg via ORAL
  Filled 2015-09-19 (×5): qty 1

## 2015-09-19 NOTE — H&P (Signed)
[facility] - [place] [facility]   PATIENT NAME: Samuel Oneill    MR#:  [id number]  DATE OF BIRTH:  1941-02-28   DATE OF ADMISSION:  09/19/2015  PRIMARY CARE PHYSICIAN: Bobbye Morton, MD   REQUESTING/REFERRING PHYSICIAN: Kinner  CHIEF COMPLAINT:   Chief Complaint  Patient presents with  . Shortness of Breath    HISTORY OF PRESENT ILLNESS:  Samuel Oneill  is a 74 y.o. male with a known history of CVA with residual right-sided paralysis, hypothyroidism unspecified presenting with shortness of breath. The patient is unable to provide meaningful information given mental status/medical condition. He is apparently spent suffering from shortness of breath which is been progressive throughout the day with associated nausea and vomiting of nonbilious nonbloody emesis. Given severity of symptoms present to the Hospital further workup and evaluation. Upon arrival he is noted to be in respiratory distress and subsequently placed on BiPAP therapy, which he currently remains. Emergency part course: Code sepsis initiated, source healthcare associated pneumonia  PAST MEDICAL HISTORY:   Past Medical History  Diagnosis Date  . Cerebrovascular disease   . Hypothyroidism   . GERD (gastroesophageal reflux disease)   . Generalized weakness   . Iron deficiency anemia   . Dysphagia following cerebrovascular disease   . 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 Vitamin D deficiency   . Constipation     PAST SURGICAL HISTORY:  History reviewed. No pertinent past surgical history.  SOCIAL HISTORY:   Social History  Substance Use Topics  . Smoking status: Unknown If Ever Smoked  . Smokeless tobacco: Not on file  . Alcohol Use: Not on file    FAMILY HISTORY:   Family History  Problem Relation Age of Onset  . Diabetes Neg Hx     DRUG ALLERGIES:   Allergies  Allergen Reactions  . Ambien  [Zolpidem Tartrate]     REVIEW OF SYSTEMS:  Unable to obtain given patient's mental status at medical condition   MEDICATIONS AT HOME:   Prior to Admission medications   Medication Sig Start Date End Date Taking? Authorizing Provider  atorvastatin (LIPITOR) 40 MG tablet Take 40 mg by mouth daily.   Yes Historical Provider, MD  camphor-menthol Wynelle Fanny) lotion Apply 1 application topically 2 (two) times daily.   Yes Historical Provider, MD  clopidogrel (PLAVIX) 75 MG tablet Take 75 mg by mouth daily.   Yes Historical Provider, MD  docusate sodium (COLACE) 100 MG capsule Take 200 mg by mouth daily as needed for mild constipation.   Yes Historical Provider, MD  levothyroxine (SYNTHROID, LEVOTHROID) 100 MCG tablet Take 100 mcg by mouth daily before breakfast.   Yes Historical Provider, MD  sertraline (ZOLOFT) 100 MG tablet Take 100 mg by mouth daily.   Yes Historical Provider, MD  Vitamin D, Cholecalciferol, 1000 UNITS CAPS Take 1,000 Units by mouth daily.   Yes Historical Provider, MD  Vitamins A & D (VITAMIN A & D) ointment Apply 1 application topically See admin instructions. Apply to sacrum and buttocks daily and as needed after toileting   Yes Historical Provider, MD  Zinc Oxide (DESITIN) 13 % CREA Apply 1 application topically See admin instructions. Apply to buttocks twice daily and twice daily as needed for moisture barrier   Yes Historical Provider, MD      VITAL SIGNS:  Blood pressure 122/55, pulse 92, temperature 101.3 F (38.5 C), temperature source Rectal, resp. rate 27,  weight 191 lb 6.4 oz (86.818 kg), SpO2 100 %.  PHYSICAL EXAMINATION:   VITAL SIGNS: Filed Vitals:   09/19/15 2140  BP: 122/55  Pulse: 92  Temp:   Resp: 27   GENERAL:74 y.o.male moderate distress given mental status/respiratory distress.  HEAD: Normocephalic, atraumatic.  EYES: Pupils equal, round, reactive to light. Unable to assess extraocular muscles given mental status/medical condition. No scleral  icterus.  MOUTH: Dry mucosal membrane. Dentition intact. No abscess noted.  EAR, NOSE, THROAT: Clear without exudates. No external lesions.  NECK: Supple. No thyromegaly. No nodules. No JVD.  PULMONARY: Diffuse coarse breath sounds throughout all lung fields, tachypneic without wheeze No use of accessory muscles, remains on BiPAP therapy FiO2 50% SaO2 100 CHEST: Nontender to palpation.  CARDIOVASCULAR: S1 and S2. Tachycardic No murmurs, rubs, or gallops. Trace edema. Pedal pulses 2+ bilaterally.  GASTROINTESTINAL: Soft, nontender, nondistended. No masses. Positive bowel sounds. No hepatosplenomegaly.  MUSCULOSKELETAL: No swelling, clubbing, or edema. Passive Range of motion full in all extremities.  NEUROLOGIC: Unable to assess given mental status/medical condition SKIN: No ulceration, lesions, rashes, or cyanosis. Skin warm and dry. Turgor intact.  PSYCHIATRIC: Unable to assess given mental status/medical condition     LABORATORY PANEL:   CBC  Recent Labs Lab 09/19/15 2041  WBC 15.7*  HGB 10.5*  HCT 33.8*  PLT 272   ------------------------------------------------------------------------------------------------------------------  Chemistries   Recent Labs Lab 09/19/15 2041  NA 142  K 3.9  CL 105  CO2 21*  GLUCOSE 212*  BUN 16  CREATININE 1.46*  CALCIUM 8.4*  AST 33  ALT 9*  ALKPHOS 78  BILITOT 1.2   ------------------------------------------------------------------------------------------------------------------  Cardiac Enzymes  Recent Labs Lab 09/19/15 2041  TROPONINI 0.07*   ------------------------------------------------------------------------------------------------------------------  RADIOLOGY:  Dg Chest Portable 1 View  09/19/2015   CLINICAL DATA:  Shortness of breath for 1 day  EXAM: PORTABLE CHEST - 1 VIEW  COMPARISON:  03/09/2014  FINDINGS: Cardiac shadow is enlarged. Postsurgical changes are again seen. The lungs are well aerated bilaterally.  Some new patchy infiltrate is noted in the right lung base. No bony abnormality is seen.  IMPRESSION: New right basilar infiltrate.   Electronically Signed   By: Alcide Clever M.D.   On: 09/19/2015 21:23    EKG:   Orders placed or performed during the hospital encounter of 09/19/15  . ED EKG  . ED EKG  . EKG 12-Lead  . EKG 12-Lead  . EKG 12-Lead  . EKG 12-Lead    IMPRESSION AND PLAN:   74 year old Caucasian gentleman history of coronary artery disease status post bypass surgery, stroke with residual right-sided paralysis presenting with shortness of breath.  1. Severe Sepsis, meeting septic criteria by leukocytosis, temperature, heart rate, respiratory rate-severe given acute kidney injury, troponin, lactic acidosis present on arrival. Source healthcare associated pneumonia Code sepsis initiated. Panculture. Broad-spectrum antibiotics including vancomycin/Zosyn and taper antibiotics when culture data returns. He is receiving a 30 mL/kg IV fluid bolus. Continue IV fluid hydration to keep mean arterial pressure greater than 65. He may require pressor therapy if blood pressure worsens. We will repeat lactic acid given the initial is greater than 2.2.  2. Acute respiratory failure with hypoxia: Indicated by respiratory rate, need for BiPAP therapy-DuoNeb treatments, supplemental oxygen oxygen, wean FiO2 as tolerated 3. Hypothyroidism unspecified Synthroid 4. Hyperlipidemia unspecified Lipitor 5. Venous embolism prophylactic: Heparin subcutaneous     All the records are reviewed and case discussed with ED provider. Management plans discussed with the patient, family and  they are in agreement.  CODE STATUS: Full  TOTAL TIME TAKING CARE OF THIS PATIENT: 45 minutes.    Hower,  Mardi Mainland.D on 09/19/2015 at 10:03 PM  Between 7am to 6pm - Pager - [phone]  After 6pm: House Pager: - [phone]  [facility]  Office  [phone]  CC: Primary care physician; Bobbye Morton, MD

## 2015-09-19 NOTE — Progress Notes (Signed)
Pt transported on Bipap to CCU 8. Tolerated well

## 2015-09-19 NOTE — ED Notes (Signed)
RT at bedside to place pt on bipap

## 2015-09-19 NOTE — ED Notes (Signed)
MD at bedside. 

## 2015-09-19 NOTE — ED Provider Notes (Signed)
[facility] Emergency Department Provider Note  ____________________________________________  Time seen: On arrival, via EMS  I have reviewed the triage vital signs and the nursing notes.   HISTORY  Chief Complaint Shortness of Breath  Patient presents in severe respiratory distress limiting history of present illness  HPI Samuel Oneill is a 74 y.o. male who presents in respiratory distress. Per EMS patient has had worsening shortness of breath throughout the day at [facility]. No fevers are reported. Patient has a history of CVA with hemiplegia and hemiparesis. Patient is a DO NOT RESUSCITATE     Past Medical History  Diagnosis Date  . Cerebrovascular disease   . Hypothyroidism   . GERD (gastroesophageal reflux disease)   . Generalized weakness   . Iron deficiency anemia   . Dysphagia following cerebrovascular disease   . 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 Vitamin D deficiency   . Constipation     There are no active problems to display for this patient.   History reviewed. No pertinent past surgical history.  No current outpatient prescriptions on file.  Allergies Ambien  History reviewed. No pertinent family history.  Social History Social History  Substance Use Topics  . Smoking status: Unknown If Ever Smoked  . Smokeless tobacco: None  . Alcohol Use: None    Review of Systems  Level V caveat: Unable to obtain review of systems secondary to severe respiratory distress    ____________________________________________   PHYSICAL EXAM:  VITAL SIGNS: ED Triage Vitals  Enc Vitals Group     BP 09/19/15 2040 134/62 mmHg     Pulse Rate 09/19/15 2040 85     Resp 09/19/15 2040 29     Temp 09/19/15 2113 101.3 F (38.5 C)     Temp Source 09/19/15 2113 Rectal     SpO2 09/19/15 2040 100 %     Weight 09/19/15 2114 191 lb 6.4 oz (86.818 kg)     Height --       Head Cir --      Peak Flow --      Pain Score --      Pain Loc --      Pain Edu? --      Excl. in GC? --      Constitutional: Significant respiratory distress Eyes: Conjunctivae are normal.  ENT   Head: Normocephalic and atraumatic.   Mouth/Throat: Mucous membranes are dry Cardiovascular: Tachycardia. Normal and symmetric distal pulses are present in all extremities. No murmurs, rubs, or gallops. Respiratory: Tachypnea, rales throughout on exam Gastrointestinal: Soft and non-tender in all quadrants. No distention. There is no CVA tenderness. Genitourinary: deferred Musculoskeletal: Nontender with normal range of motion in all extremities. No lower extremity tenderness nor edema. Neurologic:  Patient is able to answer questions properly Skin:  Skin is warm, dry and intact. No rash noted. Psychiatric: Patient is appropriately anxious  ____________________________________________    LABS (pertinent positives/negatives)  Labs Reviewed  CBC - Abnormal; Notable for the following:    WBC 15.7 (*)    RBC 3.53 (*)    Hemoglobin 10.5 (*)    HCT 33.8 (*)    MCHC 31.0 (*)    RDW 17.7 (*)    All other components within normal limits  BLOOD GAS, ARTERIAL - Abnormal; Notable for the following:    pH, Arterial 7.27 (*)    pO2, Arterial 257 (*)    Bicarbonate  20.7 (*)    Acid-base deficit 6.2 (*)    All other components within normal limits  URINE CULTURE  CULTURE, BLOOD (ROUTINE X 2)  CULTURE, BLOOD (ROUTINE X 2)  COMPREHENSIVE METABOLIC PANEL  TROPONIN I  APTT  PROTIME-INR  LACTIC ACID, PLASMA  LACTIC ACID, PLASMA  BRAIN NATRIURETIC PEPTIDE  URINALYSIS COMPLETEWITH MICROSCOPIC ([facility] ONLY)    ____________________________________________   EKG  ED ECG REPORT I, Jene Every, the attending physician, personally viewed and interpreted this ECG.   Date: 09/19/2015  EKG Time: 8:40 PM  Rate: 87  Rhythm: LBBB  Axis: Normal  Intervals:left bundle branch block   ST&T Change: Nonspecific   ____________________________________________    RADIOLOGY I have personally reviewed any xrays that were ordered on this patient: Right basilar infiltrate noted  ____________________________________________   PROCEDURES  Procedure(s) performed: none  Critical Care performed: yes  CRITICAL CARE Performed by: Jene Every   Total critical care time:30 min  Critical care time was exclusive of separately billable procedures and treating other patients.  Critical care was necessary to treat or prevent imminent or life-threatening deterioration.  Critical care was time spent personally by me on the following activities: development of treatment plan with patient and/or surrogate as well as nursing, discussions with consultants, evaluation of patient's response to treatment, examination of patient, obtaining history from patient or surrogate, ordering and performing treatments and interventions, ordering and review of laboratory studies, ordering and review of radiographic studies, pulse oximetry and re-evaluation of patient's condition.   ____________________________________________   INITIAL IMPRESSION / ASSESSMENT AND PLAN / ED COURSE  Pertinent labs & imaging results that were available during my care of the patient were reviewed by me and considered in my medical decision making (see chart for details).  Patient presented respiratory distress. We immediately had respiratory therapy start BiPAP. Patient required small dose of Ativan to tolerate BiPAP. 2 IV started. Patient noted to have elevated temperature rectally. Broad-spectrum antibiotics ordered presumptively for pneumonia.   Chest x-ray confirms new right basilar infiltrate. There is some debate about whether the patient may have aspirated today. ABG is reassuring we will try to discontinue BiPAP. Elevated troponin likely related to strain.  Presentation is consistent with severe  sepsis  ____________________________________________   FINAL CLINICAL IMPRESSION(S) / ED DIAGNOSES  Final diagnoses:  Healthcare-associated pneumonia  Acute respiratory failure with hypoxia     Jene Every, MD 09/19/15 2144

## 2015-09-19 NOTE — Progress Notes (Signed)
Pt placed on Bipap. Moderate anxiety with Bipap mask. ABG drawn. Pt tolerated ABG stick well.

## 2015-09-19 NOTE — ED Notes (Signed)
Pt arrives via EMS from [facility]  Pt with SOB all day that progressively worsened throughout the day.  Pt c/o generalized weakness and had several episodes of vomiting.  Pt on NRB and pale upon arrival.  Dr. Cyril Loosen, RT at bedside.

## 2015-09-20 ENCOUNTER — Encounter: Payer: Self-pay | Admitting: Physician Assistant

## 2015-09-20 ENCOUNTER — Inpatient Hospital Stay (HOSPITAL_COMMUNITY)
Admit: 2015-09-20 | Discharge: 2015-09-20 | Disposition: A | Discharge status: Home / Self Care | Payer: Medicare (Managed Care) | Attending: Internal Medicine | Admitting: Internal Medicine

## 2015-09-20 ENCOUNTER — Inpatient Hospital Stay: Payer: Medicare (Managed Care)

## 2015-09-20 DIAGNOSIS — I5023 Acute on chronic systolic (congestive) heart failure: Secondary | ICD-10-CM

## 2015-09-20 DIAGNOSIS — E876 Hypokalemia: Secondary | ICD-10-CM

## 2015-09-20 DIAGNOSIS — J9601 Acute respiratory failure with hypoxia: Secondary | ICD-10-CM

## 2015-09-20 DIAGNOSIS — I350 Nonrheumatic aortic (valve) stenosis: Secondary | ICD-10-CM

## 2015-09-20 DIAGNOSIS — L8990 Pressure ulcer of unspecified site, unspecified stage: Secondary | ICD-10-CM | POA: Insufficient documentation

## 2015-09-20 LAB — BLOOD GAS, ARTERIAL
Acid-base deficit: 1.2 mmol/L (ref 0.0–2.0)
Allens test (pass/fail): POSITIVE — AB
Bicarbonate: 23.6 mEq/L (ref 21.0–28.0)
DELIVERY SYSTEMS: POSITIVE
EXPIRATORY PAP: 6
FIO2: 35
INSPIRATORY PAP: 14
O2 Saturation: 99.3 %
PH ART: 7.39 (ref 7.350–7.450)
Patient temperature: 37
RATE: 12 resp/min
pCO2 arterial: 39 mmHg (ref 32.0–48.0)
pO2, Arterial: 151 mmHg — ABNORMAL HIGH (ref 83.0–108.0)

## 2015-09-20 LAB — TROPONIN I
Troponin I: 0.08 ng/mL — ABNORMAL HIGH (ref <0.031)
Troponin I: 0.08 ng/mL — ABNORMAL HIGH (ref <0.031)
Troponin I: 0.09 ng/mL — ABNORMAL HIGH (ref <0.031)

## 2015-09-20 LAB — POTASSIUM
Potassium: 2.6 mmol/L — CL (ref 3.5–5.1)
Potassium: 3 mmol/L — ABNORMAL LOW (ref 3.5–5.1)

## 2015-09-20 LAB — MRSA PCR SCREENING: MRSA BY PCR: NEGATIVE

## 2015-09-20 LAB — BASIC METABOLIC PANEL
ANION GAP: 8 (ref 5–15)
BUN: 18 mg/dL (ref 6–20)
CALCIUM: 8 mg/dL — AB (ref 8.9–10.3)
CO2: 25 mmol/L (ref 22–32)
Chloride: 111 mmol/L (ref 101–111)
Creatinine, Ser: 1.39 mg/dL — ABNORMAL HIGH (ref 0.61–1.24)
GFR, EST AFRICAN AMERICAN: 56 mL/min — AB (ref >60)
GFR, EST NON AFRICAN AMERICAN: 48 mL/min — AB (ref >60)
Glucose, Bld: 145 mg/dL — ABNORMAL HIGH (ref 65–99)
Potassium: 2.2 mmol/L — CL (ref 3.5–5.1)
SODIUM: 144 mmol/L (ref 135–145)

## 2015-09-20 LAB — MAGNESIUM: MAGNESIUM: 1.7 mg/dL (ref 1.7–2.4)

## 2015-09-20 LAB — CBC
HCT: 29.9 % — ABNORMAL LOW (ref 40.0–52.0)
HEMOGLOBIN: 9.6 g/dL — AB (ref 13.0–18.0)
MCH: 29.8 pg (ref 26.0–34.0)
MCHC: 32 g/dL (ref 32.0–36.0)
MCV: 93.3 fL (ref 80.0–100.0)
Platelets: 165 10*3/uL (ref 150–440)
RBC: 3.21 MIL/uL — AB (ref 4.40–5.90)
RDW: 17.4 % — ABNORMAL HIGH (ref 11.5–14.5)
WBC: 17.3 10*3/uL — AB (ref 3.8–10.6)

## 2015-09-20 LAB — LACTIC ACID, PLASMA: LACTIC ACID, VENOUS: 2.2 mmol/L — AB (ref 0.5–2.0)

## 2015-09-20 LAB — PHOSPHORUS: PHOSPHORUS: 3.1 mg/dL (ref 2.5–4.6)

## 2015-09-20 LAB — BRAIN NATRIURETIC PEPTIDE

## 2015-09-20 MED ORDER — FUROSEMIDE 10 MG/ML IJ SOLN
20.0000 mg | Freq: Three times a day (TID) | INTRAMUSCULAR | Status: AC
  Administered 2015-09-20 (×2): 20 mg via INTRAVENOUS
  Filled 2015-09-20 (×2): qty 2

## 2015-09-20 MED ORDER — POTASSIUM CHLORIDE 10 MEQ/100ML IV SOLN
10.0000 meq | INTRAVENOUS | Status: AC
  Administered 2015-09-20 (×4): 10 meq via INTRAVENOUS
  Filled 2015-09-20 (×5): qty 100

## 2015-09-20 MED ORDER — POTASSIUM CHLORIDE 20 MEQ/15ML (10%) PO SOLN
40.0000 meq | Freq: Two times a day (BID) | ORAL | Status: DC
  Administered 2015-09-20: 40 meq via ORAL
  Filled 2015-09-20: qty 30

## 2015-09-20 MED ORDER — FUROSEMIDE 10 MG/ML IJ SOLN
20.0000 mg | Freq: Once | INTRAMUSCULAR | Status: AC
  Administered 2015-09-20: 20 mg via INTRAVENOUS
  Filled 2015-09-20: qty 2

## 2015-09-20 MED ORDER — POTASSIUM CHLORIDE 10 MEQ/100ML IV SOLN
10.0000 meq | INTRAVENOUS | Status: AC
  Administered 2015-09-20 (×4): 10 meq via INTRAVENOUS
  Filled 2015-09-20 (×4): qty 100

## 2015-09-20 MED ORDER — FUROSEMIDE 10 MG/ML IJ SOLN
40.0000 mg | Freq: Two times a day (BID) | INTRAMUSCULAR | Status: DC
  Administered 2015-09-21 – 2015-09-22 (×3): 40 mg via INTRAVENOUS
  Filled 2015-09-20 (×3): qty 4

## 2015-09-20 NOTE — Progress Notes (Signed)
MD informed of potassium of 2.2.

## 2015-09-20 NOTE — Consult Note (Signed)
[facility] Critical Care Medicine Consultation     ASSESSMENT/PLAN    PULMONARY  A: Acute hypoxic respiratory failure due to pulmonary edema. -Pneumonia, possibly healthcare associated with sepsis. P:   Agree with stopping IV fluids and starting the patient on Lasix, will continue. In addition, we will place a Foley catheter to better monitor intake and output.  CARDIOVASCULAR  A: History of systolic heart failure, currently with acute exacerbation of systolic heart failure. P:  Repeat echo is pending, will continue diuresis as above.  RENAL A:  Acute kidney injury P:   We'll continue to monitor.  GASTROINTESTINAL --  HEMATOLOGIC A:  Leukocytosis P:  Possibly secondary to sepsis.  INFECTIOUS A:  Sepsis due to pneumonia. P:   Continue vancomycin and Zosyn. Urine and blood cultures are pending at this time.  ENDOCRINE --  NEUROLOGIC A: Metabolic encephalopathy likely due to sepsis. P:   We'll continue to monitor.   ---------------------------------------  ---------------------------------------   Name: Samuel Oneill MRN: [id number] DOB: 10-May-1941    ADMISSION DATE:  09/19/2015 CONSULTATION DATE:  09/20/2015  REFERRING MD :  Dr. 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 Therefore, all history was obtained from the chart and from staff.  The patient was initially thought to have pneumonia, with sepsis. He was started on the code sepsis protocol, received fluid boluses. Subsequently, the patient appeared to  develop pulmonary edema. Subsequently, a BNP was checked and was noted to be greater than 4000. He was seen by Dr. Imogene Burn, this a.m. and fluids were stopped and the patient was started on diuresis.  I reviewed the chest x-ray from 09/20/2015 images as well as report, there appears to be right mid zone infiltrate consistent with pneumonia, this image was reviewed. In comparison with previous chest x-ray film from August 2014.  Per the chart: Samuel Oneill is a 74 y.o. male with a known history of CVA with residual right-sided paralysis, hypothyroidism unspecified presenting with shortness of breath. The patient is unable to provide meaningful information given mental status/medical condition. He is apparently spent suffering from shortness of breath which is been progressive throughout the day with associated nausea and vomiting of nonbilious nonbloody emesis. Given severity of symptoms present to the Hospital further workup and evaluation. Upon arrival he is noted to be in respiratory distress and subsequently placed on BiPAP therapy, which he currently remains. Emergency part course: Code sepsis initiated, source healthcare associated pneumonia  PAST MEDICAL HISTORY :  Past Medical History  Diagnosis Date  . Cerebrovascular disease   . Hypothyroidism   . GERD (gastroesophageal reflux disease)   . Generalized weakness   . Iron deficiency anemia   . Dysphagia following cerebrovascular disease   . 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 Vitamin D deficiency   . Constipation    History reviewed. No pertinent past surgical history. Prior to Admission medications   Medication Sig Start Date End Date Taking? Authorizing Provider  atorvastatin (LIPITOR) 40 MG tablet Take 40 mg by mouth daily.   Yes  Historical Provider, MD  camphor-menthol Wynelle Fanny) lotion Apply 1 application topically 2 (two) times daily.   Yes Historical Provider, MD    clopidogrel (PLAVIX) 75 MG tablet Take 75 mg by mouth daily.   Yes Historical Provider, MD  docusate sodium (COLACE) 100 MG capsule Take 200 mg by mouth daily as needed for mild constipation.   Yes Historical Provider, MD  levothyroxine (SYNTHROID, LEVOTHROID) 100 MCG tablet Take 100 mcg by mouth daily before breakfast.   Yes Historical Provider, MD  sertraline (ZOLOFT) 100 MG tablet Take 100 mg by mouth daily.   Yes Historical Provider, MD  Vitamin D, Cholecalciferol, 1000 UNITS CAPS Take 1,000 Units by mouth daily.   Yes Historical Provider, MD  Vitamins A & D (VITAMIN A & D) ointment Apply 1 application topically See admin instructions. Apply to sacrum and buttocks daily and as needed after toileting   Yes Historical Provider, MD  Zinc Oxide (DESITIN) 13 % CREA Apply 1 application topically See admin instructions. Apply to buttocks twice daily and twice daily as needed for moisture barrier   Yes Historical Provider, MD   Allergies  Allergen Reactions  . Ambien [Zolpidem Tartrate]     FAMILY HISTORY:  Family History  Problem Relation Age of Onset  . Diabetes Neg Hx    SOCIAL HISTORY:  has no tobacco, alcohol, and drug history on file.  REVIEW OF SYSTEMS:   Patient is currently in respiratory distress. Full facial BiPAP. Therefore, no review of systems could be obtained.    VITAL SIGNS: Temp:  [96.6 F (35.9 C)-102 F (38.9 C)] 96.6 F (35.9 C) (09/26 0800) Pulse Rate:  [57-94] 75 (09/26 0700) Resp:  [14-33] 14 (09/26 0700) BP: (103-134)/(51-106) 133/64 mmHg (09/26 0700) SpO2:  [95 %-100 %] 100 % (09/26 0700) FiO2 (%):  [30 %] 30 % (09/26 0800) Weight:  [85.3 kg (188 lb 0.8 oz)-86.818 kg (191 lb 6.4 oz)] 85.3 kg (188 lb 0.8 oz) (09/25 2208) HEMODYNAMICS:   VENTILATOR SETTINGS: Vent Mode:  [-]  FiO2 (%):  [30 %] 30 % INTAKE / OUTPUT:  Intake/Output Summary (Last 24 hours) at 09/20/15 0837 Last data filed at 09/20/15 0600  Gross per 24 hour  Intake 831.25 ml  Output       0 ml  Net 831.25 ml    Physical Examination:   VS: BP 133/64 mmHg  Pulse 75  Temp(Src) 96.6 F (35.9 C) (Axillary)  Resp 14  Ht  (1.854 m)  Wt 85.3 kg (188 lb 0.8 oz)  BMI 24.82 kg/m2  SpO2 100%  General Appearance: No distress  Neuro:without focal findings, patient is lethargic and does not follow commands. HEENT: PERRLA, EOM intact, no ptosis, Pulmonary: Bilateral crackles noted, no wheezes noted. CardiovascularNormal S1,S2.  No m/r/g.    Abdomen: Benign, Soft, non-tender, No masses,  Renal:  No costovertebral tenderness  GU:  Not performed at this time. Endoc: No evident thyromegaly, no signs of acromegaly. Skin:   warm, no rashes, no ecchymosis  Extremities: normal, no cyanosis, clubbing, no edema,    LABS: Reviewed   LABORATORY PANEL:   CBC  Recent Labs Lab 09/20/15 0539  WBC 17.3*  HGB 9.6*  HCT 29.9*  PLT 165    Chemistries   Recent Labs Lab 09/19/15 2041 09/20/15 0539  NA 142 144  K 3.9 2.2*  CL 105 111  CO2 21* 25  GLUCOSE 212* 145*  BUN 16 18  CREATININE 1.46* 1.39*  CALCIUM 8.4* 8.0*  AST 33  --  ALT 9*  --   ALKPHOS 78  --   BILITOT 1.2  --     No results for input(s): GLUCAP in the last 168 hours.  Recent Labs Lab 09/19/15 2039  PHART 7.27*  PCO2ART 45  PO2ART 257*    Recent Labs Lab 09/19/15 2041  AST 33  ALT 9*  ALKPHOS 78  BILITOT 1.2  ALBUMIN 3.3*    Cardiac Enzymes  Recent Labs Lab 09/20/15 0539  TROPONINI 0.09*    RADIOLOGY:  Dg Chest Portable 1 View  09/19/2015   CLINICAL DATA:  Shortness of breath for 1 day  EXAM: PORTABLE CHEST - 1 VIEW  COMPARISON:  03/09/2014  FINDINGS: Cardiac shadow is enlarged. Postsurgical changes are again seen. The lungs are well aerated bilaterally. Some new patchy infiltrate is noted in the right lung base. No bony abnormality is seen.  IMPRESSION: New right basilar infiltrate.   Electronically Signed   By: Alcide Clever M.D.   On: 09/19/2015 21:23       --Wells Guiles, MD.  Board Certified in Internal Medicine, Pulmonary Medicine, Critical Care Medicine, and Sleep Medicine.  Pager [phone]  Pulmonary and Critical Care Office Number: [phone]  Santiago Glad, M.D.  Stephanie Acre, M.D.  Billy Fischer, M.D   09/20/2015, 8:37 AM  Critical Care Attestation.  I have personally obtained a history, examined the patient, evaluated laboratory and imaging results, formulated the assessment and plan and placed orders. The Patient requires high complexity decision making for assessment and support, frequent evaluation and titration of therapies, application of advanced monitoring technologies and extensive interpretation of multiple databases. The patient has critical illness that could lead imminently to failure of 1 or more organ systems and requires the highest level of physician preparedness to intervene.  Critical Care Time devoted to patient care services described in this note is 35 minutes and is exclusive of time spent in procedures.

## 2015-09-20 NOTE — Progress Notes (Signed)
Nutrition Follow-up    INTERVENTION:   Medical Food Supplement Therapy: recommend addition of nutritional supplement once diet advanced   NUTRITION DIAGNOSIS:   Inadequate oral intake related to lethargy/confusion, acute illness as evidenced by NPO status.  GOAL:   Patient will meet greater than or equal to 90% of their needs  MONITOR:    (Energy Intake, Anthropometrics, Electrolyte/Renal Profile, Digestive System)  REASON FOR ASSESSMENT:   Malnutrition Screening Tool    ASSESSMENT:    Pt admitted with sepsis, pneumonia, ARF, possible CHF; pt confused, lethargic, off Bipap at present, on 2L [place]  Past Medical History  Diagnosis Date  . Cerebrovascular disease   . Hypothyroidism   . GERD (gastroesophageal reflux disease)   . Generalized weakness   . Iron deficiency anemia   . Dysphagia following cerebrovascular disease   . 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 Vitamin D deficiency   . Constipation   . CAD (coronary artery disease)     a. s/p CABG in 2010; b. nuc stress test 07/2013: no sig ischemia, large ant/apical/inf scar, EF 38%, normal study  . Ischemic cardiomyopathy     a. echo 01/2013: EF 25-35%, mile to mod AI  . Chronic systolic CHF (congestive heart failure)     a. echo 07/2013: 45-50%, mild biatrial enlargement, mild MR, mild aortic scl w/o stenosis, midl to mod AI, mild TR  . Stroke     a. residual right sided paralysis     Diet Order:  Diet NPO time specified Except for: Sips with Meds  Skin:   (stage II sacrum)  Electrolyte and Renal Profile:  Recent Labs Lab 09/19/15 2041 09/20/15 0539 09/20/15 1146  BUN 16 18  --   CREATININE 1.46* 1.39*  --   NA 142 144  --   K 3.9 2.2*  --   MG  --   --  1.7  PHOS  --   --  3.1   Glucose Profile: No results for input(s): GLUCAP in the last 72 hours.   Meds: lasix  Nutrition focused physical exam:  Unable to complete Nutrition-Focused  physical exam at this time.    Height:   Ht Readings from Last 1 Encounters:  09/19/15  (1.854 m)    Weight:   Wt Readings from Last 1 Encounters:  09/19/15 188 lb 0.8 oz (85.3 kg)   Filed Weights   09/19/15 2114 09/19/15 2208  Weight: 191 lb 6.4 oz (86.818 kg) 188 lb 0.8 oz (85.3 kg)    BMI:  Body mass index is 24.82 kg/(m^2).  Estimated Nutritional Needs:   Kcal:  2157-2352 kcals (BEE 1634, 1.2 AF, 1.1-1.2 IF)   Protein:  94-111 g (1.1-1.3 g/kg)   Fluid:  2125-2550 mL (25-30 ml/kg)  HIGH Care Level  Romelle Starcher MS, RD, LDN [phone] Pager

## 2015-09-20 NOTE — Progress Notes (Signed)
ANTIBIOTIC CONSULT NOTE - INITIAL  Pharmacy Consult for vancomycin and Zosyn dosing Indication: HCAP/sepsis  Allergies  Allergen Reactions  . Ambien [Zolpidem Tartrate]     Patient Measurements: Height:  (185.4 cm) Weight: 188 lb 0.8 oz (85.3 kg) IBW/kg (Calculated) : 79.9 Adjusted Body Weight: 86kg  Vital Signs: Temp: 102 F (38.9 C) (09/25 2200) Temp Source: Axillary (09/25 2200) BP: 112/60 mmHg (09/25 2200) Pulse Rate: 70 (09/25 2328) Intake/Output from previous day:   Intake/Output from this shift:    Labs:  Recent Labs  09/19/15 2041  WBC 15.7*  HGB 10.5*  PLT 272  CREATININE 1.46*   Estimated Creatinine Clearance: 49.4 mL/min (by C-G formula based on Cr of 1.46). No results for input(s): VANCOTROUGH, VANCOPEAK, VANCORANDOM, GENTTROUGH, GENTPEAK, GENTRANDOM, TOBRATROUGH, TOBRAPEAK, TOBRARND, AMIKACINPEAK, AMIKACINTROU, AMIKACIN in the last 72 hours.   Microbiology: Recent Results (from the past 720 hour(s))  MRSA PCR Screening     Status: None   Collection Time: 09/19/15 10:42 PM  Result Value Ref Range Status   MRSA by PCR NEGATIVE NEGATIVE Final    Comment:        The GeneXpert MRSA Assay (FDA approved for NASAL specimens only), is one component of a comprehensive MRSA colonization surveillance program. It is not intended to diagnose MRSA infection nor to guide or monitor treatment for MRSA infections.     Medical History: Past Medical History  Diagnosis Date  . Cerebrovascular disease   . Hypothyroidism   . GERD (gastroesophageal reflux disease)   . Generalized weakness   . Iron deficiency anemia   . Dysphagia following cerebrovascular disease   . 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 Vitamin D deficiency   . Constipation     Medications:   Assessment: Blood and urine cx pending UA: (-) CXR: new R base infiltrate  Goal of Therapy:  Vancomycin trough level 15-20  mcg/ml  Plan:  TBW 86kg  IBW 79.9kg DW 86 kg  Vd 60L kei 0.945 hr-1  t/2 15 hours 1250 mg q 18 hours ordered with stacked dosing. Level before 5th dose.  Zosyn 3.375 grams q 8 hours ordered.  Fulton Reek, PharmD, BCPS  09/20/2015

## 2015-09-20 NOTE — Progress Notes (Signed)
*  PRELIMINARY RESULTS* Echocardiogram 2D Echocardiogram has been performed.  Samuel Oneill Hege 09/20/2015, 2:57 PM

## 2015-09-20 NOTE — Progress Notes (Addendum)
[facility] [place] at [facility]   PATIENT NAME: Samuel Oneill    MR#:  [id number]  DATE OF BIRTH:  08/15/1941  SUBJECTIVE:  CHIEF COMPLAINT:   Chief Complaint  Patient presents with  . Shortness of Breath  The patient is confused, unable to an, on BIPAP.  REVIEW OF SYSTEMS:  Unable to get ROS.   DRUG ALLERGIES:   Allergies  Allergen Reactions  . Ambien [Zolpidem Tartrate]     VITALS:  Blood pressure 133/64, pulse 75, temperature 98.4 F (36.9 C), temperature source Axillary, resp. rate 14, height  (1.854 m), weight 85.3 kg (188 lb 0.8 oz), SpO2 100 %.  PHYSICAL EXAMINATION:  GENERAL:  74 y.o.-year-old patient lying in the bed, on BIPAP. EYES: Pupils equal, round, reactive to light and accommodation. No scleral icterus.  HEENT: Head atraumatic, normocephalic.  NECK:  Supple, no jugular venous distention. No thyroid enlargement, no tenderness.  LUNGS: Normal breath sounds bilaterally, no wheezing, bilateral crackles. Mild use of accessory muscles of respiration.  CARDIOVASCULAR: S1, S2 normal. No murmurs, rubs, or gallops.  ABDOMEN: Soft, nontender, nondistended. Bowel sounds present. No organomegaly or mass.  EXTREMITIES: Bilateral leg edema 1+, no cyanosis, or clubbing.  NEUROLOGIC:unable to exam. PSYCHIATRIC: The patient is lethargic and confused, unable to answer questions.  SKIN: No obvious rash, lesion, but sacral DU stage 2.    LABORATORY PANEL:   CBC  Recent Labs Lab 09/20/15 0539  WBC 17.3*  HGB 9.6*  HCT 29.9*  PLT 165   ------------------------------------------------------------------------------------------------------------------  Chemistries   Recent Labs Lab 09/19/15 2041 09/20/15 0539  NA 142 144  K 3.9 2.2*  CL 105 111  CO2 21* 25  GLUCOSE 212* 145*  BUN 16 18  CREATININE 1.46* 1.39*  CALCIUM 8.4* 8.0*  AST 33  --   ALT 9*  --   ALKPHOS 78  --   BILITOT 1.2  --     ------------------------------------------------------------------------------------------------------------------  Cardiac Enzymes  Recent Labs Lab 09/20/15 0539  TROPONINI 0.09*   ------------------------------------------------------------------------------------------------------------------  RADIOLOGY:  Dg Chest Portable 1 View  09/19/2015   CLINICAL DATA:  Shortness of breath for 1 day  EXAM: PORTABLE CHEST - 1 VIEW  COMPARISON:  03/09/2014  FINDINGS: Cardiac shadow is enlarged. Postsurgical changes are again seen. The lungs are well aerated bilaterally. Some new patchy infiltrate is noted in the right lung base. No bony abnormality is seen.  IMPRESSION: New right basilar infiltrate.   Electronically Signed   By: Alcide Clever M.D.   On: 09/19/2015 21:23    EKG:   Orders placed or performed during the hospital encounter of 09/19/15  . ED EKG  . ED EKG  . EKG 12-Lead  . EKG 12-Lead  . EKG 12-Lead  . EKG 12-Lead    ASSESSMENT AND PLAN:   1. Severe Sepsis and healthcare associated pneumonia, Continue vancomycin/Zosyn, f/u panculture.    2. Acute respiratory failure with hypoxia:  continue BiPAP therapy, DuoNeb, ABG. Critical care consult.  3. ARF. Treated with NS iv, but b/l lung rales, leg edema. Hold NS iv. F/u BMP.  4. Acute on chronic systolic CHF, b/l lung rales, leg edema, BNP>4500, hold NS iv, lasix 20 mg iv one dose, echo.  5. Severe Hypokalemia. K2.2, KCL iv and PO, f/u K, mag,  Phos.  * elevated troponin, due to demand ischemia. Continue plavix and lipitor, get echo and cardiology consult.  * sacral DU stage 2.  Wound care.   I discussed  with critical care physician, Dr. Darnelle Catalan. All the records are reviewed and case discussed with Care Management/Social Workerr. Management plans discussed with the patient's POA, Ms. Irving Burton and she is in agreement.  CODE STATUS: Full Code  TOTAL CRITICAL TIME TAKING CARE OF THIS PATIENT: 62 minutes.   POSSIBLE D/C IN  >3 DAYS, DEPENDING ON CLINICAL CONDITION.   Shaune Pollack M.D on 09/20/2015 at 7:52 AM  Between 7am to 6pm - Pager - [phone]  After 6pm go to [email or web address] - password [email or web address] [facility]  [facility]  Office  [phone]  CC: Primary care physician; Bobbye Morton, MD

## 2015-09-20 NOTE — Progress Notes (Signed)
Pharmacy Consult for Electrolyte Management  Allergies  Allergen Reactions  . Ambien [Zolpidem Tartrate]     Patient Measurements: Height:  (185.4 cm) Weight: 188 lb 0.8 oz (85.3 kg) IBW/kg (Calculated) : 79.9   Vital Signs: Temp: 96.6 F (35.9 C) (09/26 0800) Temp Source: Axillary (09/26 0800) BP: 111/55 mmHg (09/26 1300) Pulse Rate: 80 (09/26 1300) Intake/Output from previous day: 09/25 0701 - 09/26 0700 In: 1081.3 [I.V.:781.3; IV Piggyback:300] Out: -  Intake/Output from this shift: Total I/O In: 450 [IV Piggyback:450] Out: 600 [Urine:600]  Labs:  Recent Labs  09/19/15 2041 09/20/15 0539  WBC 15.7* 17.3*  HGB 10.5* 9.6*  HCT 33.8* 29.9*  PLT 272 165  APTT 29  --   INR 1.34  --      Recent Labs  09/19/15 2041 09/20/15 0539 09/20/15 1146 09/20/15 1352  NA 142 144  --   --   K 3.9 2.2*  --  2.6*  CL 105 111  --   --   CO2 21* 25  --   --   GLUCOSE 212* 145*  --   --   BUN 16 18  --   --   CREATININE 1.46* 1.39*  --   --   CALCIUM 8.4* 8.0*  --   --   MG  --   --  1.7  --   PHOS  --   --  3.1  --   PROT 6.9  --   --   --   ALBUMIN 3.3*  --   --   --   AST 33  --   --   --   ALT 9*  --   --   --   ALKPHOS 78  --   --   --   BILITOT 1.2  --   --   --    Estimated Creatinine Clearance: 51.9 mL/min (by C-G formula based on Cr of 1.39).   No results for input(s): GLUCAP in the last 72 hours.  Medical History: Past Medical History  Diagnosis Date  . Cerebrovascular disease   . Hypothyroidism   . GERD (gastroesophageal reflux disease)   . Generalized weakness   . Iron deficiency anemia   . Dysphagia following cerebrovascular disease   . 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 Vitamin D deficiency   . Constipation   . CAD (coronary artery disease)     a. s/p CABG in 2010; b. nuc stress test 07/2013: no sig ischemia, large ant/apical/inf scar, EF 38%, normal study  . Ischemic  cardiomyopathy     a. echo 01/2013: EF 25-35%, mile to mod AI  . Chronic systolic CHF (congestive heart failure)     a. echo 07/2013: 45-50%, mild biatrial enlargement, mild MR, mild aortic scl w/o stenosis, midl to mod AI, mild TR  . Stroke     a. residual right sided paralysis     Medications:  Scheduled:  . atorvastatin  40 mg Oral Daily  . camphor-menthol  1 application Topical BID  . cholecalciferol  1,000 Units Oral Daily  . clopidogrel  75 mg Oral Daily  . furosemide  20 mg Intravenous Q8H  . heparin  5,000 Units Subcutaneous 3 times per day  . ipratropium-albuterol  3 mL Nebulization Q4H  . levothyroxine  100 mcg Oral QAC breakfast  . piperacillin-tazobactam (ZOSYN)  IV  3.375 g Intravenous 3 times per day  . potassium  chloride  10 mEq Intravenous Q1 Hr x 4  . potassium chloride  40 mEq Oral BID  . sertraline  100 mg Oral Daily  . sodium chloride  3 mL Intravenous Q12H  . vancomycin  1,250 mg Intravenous Q18H  . vitamin A & D  1 application Topical See admin instructions  . zinc oxide  1 application Topical See admin instructions   Infusions:   PRN: acetaminophen **OR** acetaminophen, docusate sodium, morphine injection, ondansetron **OR** ondansetron (ZOFRAN) IV, oxyCODONE  Assessment: Pharmacy consulted to assist in managing electrolytes in this 74 y/o M with sepsis.   Plan:  Potassium remains low after 4 runs of potassium chloride 10 meq IV. Patient is unable to take po potassium supplement as ordered. MD ordered an additional 4 runs of KCL. Will recheck potassium at 2000.   Luisa Hart D 09/20/2015,3:10 PM

## 2015-09-20 NOTE — Progress Notes (Signed)
Pharmacy Consult for Electrolyte Management  Allergies  Allergen Reactions  . Ambien [Zolpidem Tartrate]     Patient Measurements: Height:  (185.4 cm) Weight: 188 lb 0.8 oz (85.3 kg) IBW/kg (Calculated) : 79.9   Vital Signs: Temp: 98.6 F (37 C) (09/26 2000) Temp Source: Axillary (09/26 2000) BP: 110/54 mmHg (09/26 2200) Pulse Rate: 86 (09/26 2200) Intake/Output from previous day: 09/25 0701 - 09/26 0700 In: 1081.3 [I.V.:781.3; IV Piggyback:300] Out: -  Intake/Output from this shift:    Labs:  Recent Labs  09/19/15 2041 09/20/15 0539  WBC 15.7* 17.3*  HGB 10.5* 9.6*  HCT 33.8* 29.9*  PLT 272 165  APTT 29  --   INR 1.34  --      Recent Labs  09/19/15 2041 09/20/15 0539 09/20/15 1146 09/20/15 1352 09/20/15 2104  NA 142 144  --   --   --   K 3.9 2.2*  --  2.6* 3.0*  CL 105 111  --   --   --   CO2 21* 25  --   --   --   GLUCOSE 212* 145*  --   --   --   BUN 16 18  --   --   --   CREATININE 1.46* 1.39*  --   --   --   CALCIUM 8.4* 8.0*  --   --   --   MG  --   --  1.7  --   --   PHOS  --   --  3.1  --   --   PROT 6.9  --   --   --   --   ALBUMIN 3.3*  --   --   --   --   AST 33  --   --   --   --   ALT 9*  --   --   --   --   ALKPHOS 78  --   --   --   --   BILITOT 1.2  --   --   --   --    Estimated Creatinine Clearance: 51.9 mL/min (by C-G formula based on Cr of 1.39).   No results for input(s): GLUCAP in the last 72 hours.  Medical History: Past Medical History  Diagnosis Date  . Cerebrovascular disease   . Hypothyroidism   . GERD (gastroesophageal reflux disease)   . Generalized weakness   . Iron deficiency anemia   . Dysphagia following cerebrovascular disease   . 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 Vitamin D deficiency   . Constipation   . CAD (coronary artery disease)     a. s/p CABG in 2010; b. nuc stress test 07/2013: no sig ischemia, large ant/apical/inf scar, EF  38%, normal study  . Ischemic cardiomyopathy     a. echo 01/2013: EF 25-35%, mile to mod AI  . Chronic systolic CHF (congestive heart failure)     a. echo 07/2013: 45-50%, mild biatrial enlargement, mild MR, mild aortic scl w/o stenosis, midl to mod AI, mild TR  . Stroke     a. residual right sided paralysis     Medications:  Scheduled:  . atorvastatin  40 mg Oral Daily  . camphor-menthol  1 application Topical BID  . cholecalciferol  1,000 Units Oral Daily  . clopidogrel  75 mg Oral Daily  . [START ON 09/21/2015] furosemide  40 mg Intravenous BID  .  heparin  5,000 Units Subcutaneous 3 times per day  . ipratropium-albuterol  3 mL Nebulization Q4H  . levothyroxine  100 mcg Oral QAC breakfast  . piperacillin-tazobactam (ZOSYN)  IV  3.375 g Intravenous 3 times per day  . potassium chloride  40 mEq Oral BID  . sertraline  100 mg Oral Daily  . sodium chloride  3 mL Intravenous Q12H  . vancomycin  1,250 mg Intravenous Q18H  . vitamin A & D  1 application Topical See admin instructions  . zinc oxide  1 application Topical See admin instructions   Infusions:   PRN: acetaminophen **OR** acetaminophen, docusate sodium, morphine injection, ondansetron **OR** ondansetron (ZOFRAN) IV, oxyCODONE  Assessment: Pharmacy consulted to assist in managing electrolytes in this 74 y/o M with sepsis.   Plan:  Potassium remains low after 4 runs of potassium chloride 10 meq IV. Patient is unable to take po potassium supplement as ordered. MD ordered an additional 4 runs of KCL. Will recheck potassium at 2000.   9/26 Potassium 3.0 @ 2104. Pt received  IV of K prior. Pt was given po after lab draw. Will not supplement any more tonight. Recheck level in the AM.  Melissa D Maccia 09/20/2015,10:27 PM

## 2015-09-20 NOTE — Consult Note (Signed)
Cardiology Consultation Note  Patient ID: Samuel Oneill, MRN: [id number], DOB/AGE: 03/11/1941 74 y.o. Admit date: 09/19/2015   Date of Consult: 09/20/2015 Primary Physician: Bobbye Morton, MD Primary Cardiologist: New to [facility]  Chief Complaint: SOB Reason for Consult: Possible CHF  HPI: 74 y.o. male with h/o CAD s/p prior PCI of LAD and LCx in 2007 s/p CABG, ischemic cardiomyopathy/chronic systolic CHF, stroke with residual right sided paralysis, HTN, DM2, iron deficiency anemia, hypothyroidism, and GERD who presented to [facility] on 9/25 with increased SOB and was found to have sepsis 2/2 PNA, acute on chronic systolic CHF, acute renal injury, and metabolic encephalopathy.   He reports having had bypass surgery in 2010 though records are not available in [place], [place], or [employer]. His last ischemic evaluation was in 07/2013 via nuclear stress testing in the setting of increased dyspnea. At that time Myoview showed large anterior apical and inferior scar with no significant active reversible ischemia. EF 38%, overall normal study with no artifact noted. Echo during that admission showed an improved EF of 45-50%, from prior of 25-35% in 01/2013, mild biatrial enlargement, mild MR, and mild AI. He has not followed up with a cardiologist since this admission.   He presented to [facility] on 9/25 with increased SOB throughout the day, ultimately requiring BiPAP. CXR showed a new right basilar infiltrate. WBC was 15.7-->17.3, lactic acid was 7.2-->2.2, SCr was 1.46-->1.39, BNP was >4500--> greater than 4500, initial troponin was 0.07-->0.08-->0.09, blood culture negative x 2, K+ 3.9-->2.2 (on IV repletion this morning), repeat CXR on 9/26 showed interstitial prominence with bilateral perihilar and lower lobe opacities compatible with edema/CHF. He has remained on BiPAP.  He had previously been receiving IV fluids as part of his sepsis protocol, currently discontinued. He has been afebrile since 10 PM on  9/25 when he had a temperature of 102 at that time. He has been placed on Lasix 20 mg q 8 hours for diuresis, vancomycin and Zosyn for antibiotic coverage, Duonebs, and echo is pending.            Past Medical History  Diagnosis Date  . Cerebrovascular disease   . Hypothyroidism   . GERD (gastroesophageal reflux disease)   . Generalized weakness   . Iron deficiency anemia   . Dysphagia following cerebrovascular disease   . 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 Vitamin D deficiency   . Constipation   . CAD (coronary artery disease)     a. s/p CABG in 2010; b. nuc stress test 07/2013: no sig ischemia, large ant/apical/inf scar, EF 38%, normal study  . Ischemic cardiomyopathy     a. echo 01/2013: EF 25-35%, mile to mod AI  . Chronic systolic CHF (congestive heart failure)     a. echo 07/2013: 45-50%, mild biatrial enlargement, mild MR, mild aortic scl w/o stenosis, midl to mod AI, mild TR  . Stroke     a. residual right sided paralysis       Most Recent Cardiac Studies: Echo 07/2013: Summary:  1. Left ventricular ejection fraction, by visual estimation, is 45 to  50%.  2. Mildly decreased global left ventricular systolic function.  3. Mildly dilated left atrium.  4. Mildly dilated right atrium.  5. Mild mitral valve regurgitation.  6. Mild thickening of the anterior and posterior mitral valve leaflets.  7. Mildly increased left ventricular internal cavity size.  8. Mild aortic valve sclerosis without stenosis.  9.  Mild to moderate aortic regurgitation. 10. Mildly increased left ventricular posterior wall thickness. 11. Mild tricuspid regurgitation.  Nuclear stress test 07/2013: Overall Impression The overall study imaging quality was deemed to be good. The left ventricular global function was severely reduced. This myocardial perfusion scan showed evidence of pathology and has an  abnormal appearance. The stress to  rest volume ratio is 0.95 for the left  ventricle. Pharmacological myocardial perfusion study with no significant ischemia  region of no scar in the anteroseptal, apical and inferior territory.  Pharmacological myocardial perfusion study with no significant ischemia. There is no artifact noted on this study. The estimated ejection fraction is 38%. There are equivocal EKG changes. This is a normal study. MOd to severely depressed LVF EF=38% Globally depressed Large ant/apical/inf scar No significant ischemia.  Summary  1. Mod to severely depressed LVF     EF=38%     Globally depressed     Large ant/apical/inf scar     No significant ischemia.  2. Pharmacological myocardial perfusion study with no significant  ischemia region of no scar in the anteroseptal, apical and inferior  territory.  3. Pharmacological myocardial perfusion study with no significant  ischemia.  4. The estimated ejection fraction is 38%.  5. The left ventricular global function was severely reduced.  6. There are equivocal EKG changes.  7. There is no artifact noted on this study.  8. This is a normal study. Diagnosing Physician: [id number] Dorothyann Peng MD  Echo 01/2013: Interpretation Summary   Ejection Fraction = 25-35%. Left ventricular systolic function is  moderate to severely reduced. Mild to moderate aortic regurgitation.   Surgical History: History reviewed. No pertinent past surgical history.   Home Meds: Prior to Admission medications   Medication Sig Start Date End Date Taking? Authorizing Provider  atorvastatin (LIPITOR) 40 MG tablet Take 40 mg by mouth daily.   Yes Historical Provider, MD  camphor-menthol Wynelle Fanny) lotion Apply 1 application topically 2 (two) times daily.   Yes Historical Provider, MD  clopidogrel (PLAVIX) 75 MG tablet Take 75 mg by mouth daily.   Yes Historical Provider, MD  docusate sodium (COLACE) 100 MG capsule Take 200 mg by mouth daily as needed for mild constipation.    Yes Historical Provider, MD  levothyroxine (SYNTHROID, LEVOTHROID) 100 MCG tablet Take 100 mcg by mouth daily before breakfast.   Yes Historical Provider, MD  sertraline (ZOLOFT) 100 MG tablet Take 100 mg by mouth daily.   Yes Historical Provider, MD  Vitamin D, Cholecalciferol, 1000 UNITS CAPS Take 1,000 Units by mouth daily.   Yes Historical Provider, MD  Vitamins A & D (VITAMIN A & D) ointment Apply 1 application topically See admin instructions. Apply to sacrum and buttocks daily and as needed after toileting   Yes Historical Provider, MD  Zinc Oxide (DESITIN) 13 % CREA Apply 1 application topically See admin instructions. Apply to buttocks twice daily and twice daily as needed for moisture barrier   Yes Historical Provider, MD    Inpatient Medications:  . atorvastatin  40 mg Oral Daily  . camphor-menthol  1 application Topical BID  . cholecalciferol  1,000 Units Oral Daily  . clopidogrel  75 mg Oral Daily  . heparin  5,000 Units Subcutaneous 3 times per day  . ipratropium-albuterol  3 mL Nebulization Q4H  . levothyroxine  100 mcg Oral QAC breakfast  . piperacillin-tazobactam (ZOSYN)  IV  3.375 g Intravenous 3 times per day  . potassium chloride  10 mEq Intravenous  Q1 Hr x 4  . potassium chloride  40 mEq Oral BID  . sertraline  100 mg Oral Daily  . sodium chloride  3 mL Intravenous Q12H  . vancomycin  1,250 mg Intravenous Q18H  . vitamin A & D  1 application Topical See admin instructions  . zinc oxide  1 application Topical See admin instructions      Allergies:  Allergies  Allergen Reactions  . Ambien [Zolpidem Tartrate]     Social History   Social History  . Marital Status: Widowed    Spouse Name: N/A  . Number of Children: N/A  . Years of Education: N/A   Occupational History  . Not on file.   Social History Main Topics  . Smoking status: Unknown If Ever Smoked  . Smokeless tobacco: Not on file  . Alcohol Use: Not on file  . Drug Use: Not on file  . Sexual  Activity: Not on file   Other Topics Concern  . Not on file   Social History Narrative     Family History  Problem Relation Age of Onset  . Diabetes Neg Hx      Review of Systems: Review of Systems  Unable to perform ROS: medical condition   Labs:  Recent Labs  09/19/15 2041 09/19/15 2339 09/20/15 0539  TROPONINI 0.07* 0.08* 0.09*   Lab Results  Component Value Date   WBC 17.3* 09/20/2015   HGB 9.6* 09/20/2015   HCT 29.9* 09/20/2015   MCV 93.3 09/20/2015   PLT 165 09/20/2015     Recent Labs Lab 09/19/15 2041 09/20/15 0539  NA 142 144  K 3.9 2.2*  CL 105 111  CO2 21* 25  BUN 16 18  CREATININE 1.46* 1.39*  CALCIUM 8.4* 8.0*  PROT 6.9  --   BILITOT 1.2  --   ALKPHOS 78  --   ALT 9*  --   AST 33  --   GLUCOSE 212* 145*   Lab Results  Component Value Date   CHOL 121 08/12/2013   HDL 29* 08/12/2013   LDLCALC 71 08/12/2013   TRIG 104 08/12/2013   No results found for: DDIMER  Radiology/Studies:  Dg Chest Portable 1 View  09/19/2015   CLINICAL DATA:  Shortness of breath for 1 day  EXAM: PORTABLE CHEST - 1 VIEW  COMPARISON:  03/09/2014  FINDINGS: Cardiac shadow is enlarged. Postsurgical changes are again seen. The lungs are well aerated bilaterally. Some new patchy infiltrate is noted in the right lung base. No bony abnormality is seen.  IMPRESSION: New right basilar infiltrate.   Electronically Signed   By: Alcide Clever M.D.   On: 09/19/2015 21:23    EKG: NSR with short PR, 87 bpm, LBBB (old)  Weights: [employer]   09/19/15 2114 09/19/15 2208  Weight: 191 lb 6.4 oz (86.818 kg) 188 lb 0.8 oz (85.3 kg)     Physical Exam: Blood pressure 133/64, pulse 75, temperature 96.6 F (35.9 C), temperature source Axillary, resp. rate 14, height  (1.854 m), weight 188 lb 0.8 oz (85.3 kg), SpO2 100 %. Body mass index is 24.82 kg/(m^2). General: Well developed, well nourished, in no acute distress. Head: Normocephalic, atraumatic, sclera non-icteric, no  xanthomas, nares are without discharge.  Neck: Negative for carotid bruits. JVD not elevated. Lungs: Decreased bilaterally with crackles at the bilateral bases. Breathing is unlabored. Heart: RRR with S1 S2. No murmurs, rubs, or gallops appreciated. Abdomen: Soft, non-tender, non-distended with normoactive bowel sounds. No  hepatomegaly. No rebound/guarding. No obvious abdominal masses. Msk:  Strength and tone appear normal for age. Extremities: No clubbing or cyanosis. 1+ pitting edema bilateral lower extremities.  Distal pedal pulses are 2+ and equal bilaterally. Neuro: Alert and oriented X 3. No facial asymmetry. No focal deficit. Moves all extremities spontaneously. Psych:  Responds to questions appropriately with a normal affect.    Assessment and Plan:  74 y.o. male with h/o CAD s/p prior PCI of LAD and LCx in 2007 s/p CABG, ischemic cardiomyopathy/chronic systolic CHF, stroke with residual right sided paralysis, HTN, DM2, iron deficiency anemia, hypothyroidism, and GERD who presented to [facility] on 9/25 with increased SOB and was found to have sepsis 2/2 PNA, acute on chronic systolic CHF, acute renal injury, and metabolic encephalopathy.  1. Acute respiratory failure with hypoxia and pulmonary edema: -Likely multifactorial including acute on chronic systolic CHF/ischemic cardiomyopathy and PNA with sepsis  -Continue aggressive IV diuresis with Lasix 20 mg q 8 hours -Await echo to evaluate LV function, wall motion, and right-sided pressure -Monitor renal function closely  -Hold IV fluids -Foley catheter to be placed for better monitoring of input and output   2. Acute on chronic systolic CHF/ischemic cardiomyopathy: -Aggressive IV diuresis as above -Echo as above -Unable to start beta blocker at this time given his acute respiratory failure, would look to start when able  -Unable to start ACEi/Entresto at this time given his renal function, would look to start when able  -At later date  would look to add in spironolactone if possible for cardiac remodeling   3. CAD s/p CABG: -Last ischemic evaluation 07/2013 as above -Plavix 75 mg daily  -Not on aspirin per history of prior stroke (per neurology note on 01/29/2013) -Look to start beta blocker and ACEi/Entresto when able as above  4. PNA with sepsis: -No longer febrile -On vancomycin and Zosyn per IM -Still requiring BiPAP to maintain O2 saturations -Duonebs per IM  5. Acute renal injury: -Stable  -Monitor with diuresis  6. Metabolic encephalopathy: -Likely in the setting of numbers 1, 2, and 4   7. Leukocytosis: -Likely in the setting of #4 -Will need close monitoring    Signed, Eula Listen, PA-C Pager: [phone] 09/20/2015, 8:59 AM

## 2015-09-21 ENCOUNTER — Inpatient Hospital Stay: Payer: Medicare (Managed Care)

## 2015-09-21 DIAGNOSIS — J189 Pneumonia, unspecified organism: Secondary | ICD-10-CM

## 2015-09-21 DIAGNOSIS — I5023 Acute on chronic systolic (congestive) heart failure: Secondary | ICD-10-CM | POA: Insufficient documentation

## 2015-09-21 DIAGNOSIS — J9601 Acute respiratory failure with hypoxia: Secondary | ICD-10-CM | POA: Insufficient documentation

## 2015-09-21 DIAGNOSIS — R0600 Dyspnea, unspecified: Secondary | ICD-10-CM | POA: Insufficient documentation

## 2015-09-21 DIAGNOSIS — R7989 Other specified abnormal findings of blood chemistry: Secondary | ICD-10-CM

## 2015-09-21 DIAGNOSIS — R6520 Severe sepsis without septic shock: Secondary | ICD-10-CM

## 2015-09-21 DIAGNOSIS — A419 Sepsis, unspecified organism: Principal | ICD-10-CM

## 2015-09-21 DIAGNOSIS — L8990 Pressure ulcer of unspecified site, unspecified stage: Secondary | ICD-10-CM

## 2015-09-21 LAB — CBC
HEMATOCRIT: 27.8 % — AB (ref 40.0–52.0)
HEMOGLOBIN: 8.9 g/dL — AB (ref 13.0–18.0)
MCH: 29.7 pg (ref 26.0–34.0)
MCHC: 32.2 g/dL (ref 32.0–36.0)
MCV: 92.2 fL (ref 80.0–100.0)
Platelets: 166 10*3/uL (ref 150–440)
RBC: 3.01 MIL/uL — AB (ref 4.40–5.90)
RDW: 17.6 % — ABNORMAL HIGH (ref 11.5–14.5)
WBC: 15.4 10*3/uL — ABNORMAL HIGH (ref 3.8–10.6)

## 2015-09-21 LAB — POTASSIUM: Potassium: 3 mmol/L — ABNORMAL LOW (ref 3.5–5.1)

## 2015-09-21 LAB — BASIC METABOLIC PANEL
ANION GAP: 10 (ref 5–15)
BUN: 19 mg/dL (ref 6–20)
CHLORIDE: 108 mmol/L (ref 101–111)
CO2: 28 mmol/L (ref 22–32)
Calcium: 8.2 mg/dL — ABNORMAL LOW (ref 8.9–10.3)
Creatinine, Ser: 1.43 mg/dL — ABNORMAL HIGH (ref 0.61–1.24)
GFR calc non Af Amer: 46 mL/min — ABNORMAL LOW (ref >60)
GFR, EST AFRICAN AMERICAN: 54 mL/min — AB (ref >60)
GLUCOSE: 128 mg/dL — AB (ref 65–99)
Potassium: 2.7 mmol/L — CL (ref 3.5–5.1)
Sodium: 146 mmol/L — ABNORMAL HIGH (ref 135–145)

## 2015-09-21 LAB — URINE CULTURE: CULTURE: NO GROWTH

## 2015-09-21 LAB — PHOSPHORUS: Phosphorus: 3.2 mg/dL (ref 2.5–4.6)

## 2015-09-21 LAB — MAGNESIUM: Magnesium: 1.7 mg/dL (ref 1.7–2.4)

## 2015-09-21 MED ORDER — MAGNESIUM SULFATE 2 GM/50ML IV SOLN
2.0000 g | Freq: Once | INTRAVENOUS | Status: AC
  Administered 2015-09-21: 2 g via INTRAVENOUS
  Filled 2015-09-21: qty 50

## 2015-09-21 MED ORDER — SODIUM CHLORIDE 0.9 % IJ SOLN: 3.0000 mL | INTRAMUSCULAR | Status: DC | PRN

## 2015-09-21 MED ORDER — POTASSIUM CHLORIDE CRYS ER 20 MEQ PO TBCR
40.0000 meq | EXTENDED_RELEASE_TABLET | Freq: Once | ORAL | Status: DC
  Filled 2015-09-21: qty 2

## 2015-09-21 MED ORDER — POTASSIUM CHLORIDE 20 MEQ PO PACK
20.0000 meq | PACK | Freq: Three times a day (TID) | ORAL | Status: DC
  Administered 2015-09-21: 20 meq via ORAL
  Filled 2015-09-21: qty 1

## 2015-09-21 MED ORDER — ENOXAPARIN SODIUM 40 MG/0.4ML ~~LOC~~ SOLN
40.0000 mg | SUBCUTANEOUS | Status: DC
  Administered 2015-09-21 – 2015-09-23 (×3): 40 mg via SUBCUTANEOUS
  Filled 2015-09-21 (×3): qty 0.4

## 2015-09-21 MED ORDER — IPRATROPIUM-ALBUTEROL 0.5-2.5 (3) MG/3ML IN SOLN
3.0000 mL | Freq: Two times a day (BID) | RESPIRATORY_TRACT | Status: DC
  Administered 2015-09-22 – 2015-09-24 (×5): 3 mL via RESPIRATORY_TRACT
  Filled 2015-09-21 (×5): qty 3

## 2015-09-21 MED ORDER — POTASSIUM CHLORIDE 20 MEQ PO PACK
40.0000 meq | PACK | Freq: Once | ORAL | Status: DC
  Filled 2015-09-21: qty 2

## 2015-09-21 MED ORDER — POTASSIUM CHLORIDE 20 MEQ/15ML (10%) PO SOLN
40.0000 meq | Freq: Two times a day (BID) | ORAL | Status: DC
  Administered 2015-09-21: 40 meq via ORAL
  Filled 2015-09-21: qty 30

## 2015-09-21 NOTE — Progress Notes (Signed)
MD aware of potassium level. Pt is alert and oriented this morning. MD to assess and place appropriate orders and change diet.

## 2015-09-21 NOTE — Progress Notes (Signed)
Ryan Dunn PA, notified of tele strip - trouble finding p waves (sinus vs. Junctional vs. Afib) - said he will come take a look at it.

## 2015-09-21 NOTE — Progress Notes (Signed)
Patient would like to speak with [name], POA, attempted to call her to give her the patient's room telephone number. No answer, will try again later.

## 2015-09-21 NOTE — Clinical Social Work Note (Signed)
Clinical Social Work Assessment  Patient Details  Name: Samuel Oneill MRN: [id number] Date of Birth: [birth year]  Date of referral:  09/21/15               Reason for consult:  Facility Placement                Permission sought to share information with:  Family Supports, Oceanographer granted to share information::  Yes, Verbal Permission Granted  Name::        Agency::     Relationship::     Contact Information:     Housing/Transportation Living arrangements for the past 2 months:  Skilled Building surveyor of Information:  Patient Patient Interpreter Needed:  None Criminal Activity/Legal Involvement Pertinent to Current Situation/Hospitalization:  No - Comment as needed Significant Relationships:  Significant Other Lives with:  Facility Resident Do you feel safe going back to the place where you live?  Yes Need for family participation in patient care:  No (Coment)  Care giving concerns:  Patient is a resident at [facility]   Social Worker assessment / plan:  CSW spoke with patient this afternoon. Patient was focused on getting his phone and needing to call someone but could not tell CSW who he wanted to call. Patient was disheveled in appearance. Patient thought he was still at [facility] but then corrected himself and said he was in the hospital. When asked if he had family or friends in the area, patient stated he had a girlfriend: Samuel Oneill and stated that he doesn't think she even knows he is in the hospital as she does not live at [facility] but lives at her home. Patient asked if CSW could try and call her. CSW attempted to call Ms. Samuel Oneill but had to leave a message. Ms. Samuel Oneill is actually patient's emergency contact listed in his medical record.   Patient is a PACE recipient as well. [facility] at [facility] informed CSW that patient has been with them for about 8 months and is a long term resident with them.   Employment  status:  Retired Database administrator PT Recommendations:  Not assessed at this time Information / Referral to community resources:     Patient/Family's Response to care:  Patient expressed appreciation for CSW assistance.   Patient/Family's Understanding of and Emotional Response to Diagnosis, Current Treatment, and Prognosis:  Patient appeared somewhat disoriented regarding where he was and if he had his phone. When CSW entered patient's room he was holding his room phone in his hand and staring at it.   CSW will continue to follow.  Emotional Assessment Appearance:  Appears older than stated age Attitude/Demeanor/Rapport:   (pleasant and worried) Affect (typically observed):  Appropriate, Pleasant Orientation:  Oriented to Self, Oriented to Place Alcohol / Substance use:  Not Applicable Psych involvement (Current and /or in the community):  No (Comment)  Discharge Needs  Concerns to be addressed:  Care Coordination Readmission within the last 30 days:  No Current discharge risk:  None Barriers to Discharge:  No Barriers Identified   York Spaniel, LCSW 09/21/2015, 1:31 PM

## 2015-09-21 NOTE — Progress Notes (Signed)
Patient: Samuel Oneill / Admit Date: 09/19/2015 / Date of Encounter: 09/21/2015, 10:16 AM   Subjective: Feeling a little better today. Off BiPAP today, now on 4L via nasal cannula. Has diuresed 2,218 mL for the admission to date. K+ remains low at 2.6 this morning s/p repletion via IV. SCr stable at 1.43. On IV Lasix 40 mg bid. Echo showed EF 15-20%, mild AS, mild to moderate MR, severe biatrial enlargement. Repeat CXR today showed mild pulmonary vascular congestion and right greater than left perihilar and bibasilar opacities. Findings may reflect mild edema, although right lower lobe PNA is also possible.   Review of Systems: Review of Systems  Constitutional: Positive for malaise/fatigue. Negative for fever, chills and diaphoresis.  Respiratory: Positive for cough, shortness of breath and wheezing. Negative for hemoptysis and sputum production.   Cardiovascular: Positive for leg swelling. Negative for chest pain, palpitations, orthopnea, claudication and PND.  Gastrointestinal: Negative for nausea and vomiting.  Musculoskeletal: Negative for falls.  Skin: Negative for rash.  Neurological: Positive for weakness. Negative for sensory change, speech change, focal weakness and loss of consciousness.     Objective: Telemetry: New onset Afib, 80's to 90's, LBBB Physical Exam: Blood pressure 117/60, pulse 92, temperature 98.3 F (36.8 C), temperature source Oral, resp. rate 20, height  (1.854 m), weight 182 lb 3.2 oz (82.645 kg), SpO2 98 %. Body mass index is 24.04 kg/(m^2). General: Well developed, well nourished, in no acute distress. Head: Normocephalic, atraumatic, sclera non-icteric, no xanthomas, nares are without discharge. Neck: Negative for carotid bruits. JVP not elevated. Lungs: Decreased breath sounds with crackles at the bilateral bases. Breathing is unlabored. Heart: RRR S1 S2. II/VI systolic murmurs. No rubs or gallops.  Abdomen: Soft, non-tender, non-distended with  normoactive bowel sounds. No rebound/guarding. Extremities: No clubbing or cyanosis. No edema. Distal pedal pulses are 2+ and equal bilaterally. Neuro: Alert and oriented X 3. Moves all extremities spontaneously. Psych:  Responds to questions appropriately with a normal affect.   Intake/Output Summary (Last 24 hours) at 09/21/15 1016 Last data filed at 09/21/15 0300  Gross per 24 hour  Intake    850 ml  Output   3900 ml  Net  -3050 ml    Inpatient Medications:  . atorvastatin  40 mg Oral Daily  . camphor-menthol  1 application Topical BID  . cholecalciferol  1,000 Units Oral Daily  . clopidogrel  75 mg Oral Daily  . furosemide  40 mg Intravenous BID  . heparin  5,000 Units Subcutaneous 3 times per day  . ipratropium-albuterol  3 mL Nebulization Q4H  . levothyroxine  100 mcg Oral QAC breakfast  . magnesium sulfate 1 - 4 g bolus IVPB  2 g Intravenous Once  . piperacillin-tazobactam (ZOSYN)  IV  3.375 g Intravenous 3 times per day  . potassium chloride  40 mEq Oral BID  . sertraline  100 mg Oral Daily  . vancomycin  1,250 mg Intravenous Q18H  . vitamin A & D  1 application Topical See admin instructions  . zinc oxide  1 application Topical See admin instructions   Infusions:    Labs:  Recent Labs  09/20/15 0539 09/20/15 1146  09/20/15 2104 09/21/15 0736  NA 144  --   --   --  146*  K 2.2*  --   < > 3.0* 2.7*  CL 111  --   --   --  108  CO2 25  --   --   --  28  GLUCOSE 145*  --   --   --  128*  BUN 18  --   --   --  19  CREATININE 1.39*  --   --   --  1.43*  CALCIUM 8.0*  --   --   --  8.2*  MG  --  1.7  --   --  1.7  PHOS  --  3.1  --   --  3.2  < > = values in this interval not displayed.  Recent Labs  09/19/15 2041  AST 33  ALT 9*  ALKPHOS 78  BILITOT 1.2  PROT 6.9  ALBUMIN 3.3*    Recent Labs  09/20/15 0539 09/21/15 0736  WBC 17.3* 15.4*  HGB 9.6* 8.9*  HCT 29.9* 27.8*  MCV 93.3 92.2  PLT 165 166    Recent Labs  09/19/15 2041  09/19/15 2339 09/20/15 0539 09/20/15 1146  TROPONINI 0.07* 0.08* 0.09* 0.08*   Invalid input(s): POCBNP No results for input(s): HGBA1C in the last 72 hours.   Weights: Filed Weights   09/19/15 2114 09/19/15 2208 09/21/15 0608  Weight: 191 lb 6.4 oz (86.818 kg) 188 lb 0.8 oz (85.3 kg) 182 lb 3.2 oz (82.645 kg)     Radiology/Studies:  Dg Chest 1 View  09/21/2015   CLINICAL DATA:  Cough, shortness of breath, and congestion.  EXAM: CHEST 1 VIEW  COMPARISON:  09/20/2015  FINDINGS: Sequelae of prior CABG are again identified. Cardiac silhouette remains moderately enlarged. Mild pulmonary vascular congestion is unchanged with mild interstitial prominence and asymmetric right perihilar and right basilar opacities, overall similar to the prior study. No pleural effusion or pneumothorax is identified.  IMPRESSION: Unchanged cardiomegaly, mild pulmonary vascular congestion, and right greater than left perihilar and bibasilar opacities. Findings may reflect mild edema, although right lower lobe pneumonia is also possible.   Electronically Signed   By: Sebastian Ache M.D.   On: 09/21/2015 07:51   Dg Chest 1 View  09/20/2015   CLINICAL DATA:  Acute respiratory failure. Pulmonary edema. Shortness of breath.  EXAM: CHEST 1 VIEW  COMPARISON:  09/19/2015  FINDINGS: Prior CABG. Cardiomegaly with vascular congestion and mild interstitial prominence, likely interstitial edema. Bilateral lower lobe opacities also likely reflect edema. No visible effusions or acute bony abnormality.  IMPRESSION: Interstitial prominence with bilateral perihilar and lower lobe opacities most compatible with edema/ CHF.   Electronically Signed   By: Charlett Nose M.D.   On: 09/20/2015 10:13   Dg Chest Portable 1 View  09/19/2015   CLINICAL DATA:  Shortness of breath for 1 day  EXAM: PORTABLE CHEST - 1 VIEW  COMPARISON:  03/09/2014  FINDINGS: Cardiac shadow is enlarged. Postsurgical changes are again seen. The lungs are well aerated  bilaterally. Some new patchy infiltrate is noted in the right lung base. No bony abnormality is seen.  IMPRESSION: New right basilar infiltrate.   Electronically Signed   By: Alcide Clever M.D.   On: 09/19/2015 21:23     Assessment and Plan  74 y.o. male with h/o CAD s/p prior PCI of LAD and LCx in 2007 s/p CABG, ischemic cardiomyopathy/chronic systolic CHF, stroke with residual right sided paralysis, HTN, DM2, iron deficiency anemia, hypothyroidism, and GERD who presented to [facility] on 9/25 with increased SOB and was found to have sepsis 2/2 PNA, acute on chronic systolic CHF, acute renal injury, and metabolic encephalopathy.  1. Acute respiratory failure with hypoxia and pulmonary edema: -Likely multifactorial including acute on  chronic systolic CHF/ischemic cardiomyopathy and PNA with sepsis  -Good urine output to date  -Continue aggressive IV diuresis with Lasix 40 mg bid -Echo as above -Given drop in EF cardiac cath to be considered once his overall functional status improves -Monitor renal function closely  -Hold IV fluids -Foley catheter to be placed for better monitoring of input and output   2. Acute on chronic systolic CHF/ischemic cardiomyopathy: -Aggressive IV diuresis as above -Echo as above -Unable to start beta blocker at this time given his acute respiratory failure, would look to start when able  -Unable to start ACEi/Entresto at this time given his renal function, would look to start when able  -At later date would look to add in spironolactone if possible for cardiac remodeling   3. CAD s/p CABG: -Last ischemic evaluation 07/2013 as above -Plavix 75 mg daily  -Not on aspirin per history of prior stroke (per neurology note on 01/29/2013) -Look to start beta blocker and ACEi/Entresto when able as above  4. PNA with sepsis: -No longer febrile -On vancomycin and Zosyn per IM -Still requiring BiPAP to maintain O2 saturations -Duonebs per IM  5. Acute renal  injury: -Stable  -Monitor with diuresis  6. Metabolic encephalopathy: -Likely in the setting of numbers 1, 2, and 4   7. Leukocytosis: -Likely in the setting of #4 -Will need close monitoring   8. Severe hypokalemia: -Continues to be low -Received 20 meq this AM -Will give KCl powder in addition   9. New onset Afib: -Seen on telemetry this morning, currently in sinus rhythm  -Checking a stat 12 lead ECG this morning -Currently rate controlled -Likely in the setting of his above acute illness and hypokalemia  -CHADSVASc at least 6 (CHF, HTN, age x 2, DM, vascular disease) giving him an estimated annual stroke risk of 9.8% -Will discuss starting heparin gtt with Dr. Mariah Milling, MD given his acute illness   Signed, Eula Listen, PA-C Pager: [phone] 09/21/2015, 10:16 AM

## 2015-09-21 NOTE — Consult Note (Signed)
[facility] Critical Care Medicine Consultation     ASSESSMENT/PLAN    PULMONARY  A: Acute hypoxic respiratory failure due to pulmonary edema. -Pneumonia, possibly healthcare associated with sepsis. P:   Agree with limited  IV fluids and continuing the patient on Lasix, will continue.  Cont with Bipap WBC trending down today, currently on vanc and zosyn.  Recommend 5 days of total antibiotics.   CARDIOVASCULAR  A: History of systolic heart failure, currently with acute exacerbation of systolic heart failure. P:  ECHO with EF 15-20%, mild AS, mod MR, Sev biatrial enlargement Volume control will paramount to overall health Cardiology following along Possible cath when has more improvement in clinical status   RENAL A:  Acute kidney injury P:   We'll continue to monitor. Had decent UOP with lasix  GASTROINTESTINAL --  HEMATOLOGIC A:  Leukocytosis P:  Possibly secondary to sepsis.  INFECTIOUS A:  Sepsis due to pneumonia. P:   Continue vancomycin and Zosyn. Urine and blood cultures are pending at this time.  ENDOCRINE --  NEUROLOGIC A: Metabolic encephalopathy likely due to sepsis. P:   We'll continue to monitor.   ---------------------------------------  ---------------------------------------   Name: Samuel Oneill MRN: [id number] DOB: March 16, 1941    ADMISSION DATE:  09/19/2015 CONSULTATION DATE:  09/20/2015  REFERRING MD :  Dr. 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 Therefore, all history was obtained  from the chart and from staff.  The patient was initially thought to have pneumonia, with sepsis. He was started on the code sepsis protocol, received fluid boluses. Subsequently, the patient appeared to develop pulmonary edema. Subsequently, a BNP was checked and was noted to be greater than 4000. He was seen by Dr. Imogene Burn, this a.m. and fluids were stopped and the patient was started on diuresis.  I reviewed the chest x-ray from 09/20/2015 images as well as report, there appears to be right mid zone infiltrate consistent with pneumonia, this image was reviewed. In comparison with previous chest x-ray film from August 2014.  Per the chart: Samuel Oneill is a 74 y.o. male with a known history of CVA with residual right-sided paralysis, hypothyroidism unspecified presenting with shortness of breath. The patient is unable to provide meaningful information given mental status/medical condition. He is apparently spent suffering from shortness of breath which is been progressive throughout the day with associated nausea and vomiting of nonbilious nonbloody emesis. Given severity of symptoms present to the Hospital further workup and evaluation. Upon arrival he is noted to be in respiratory distress and subsequently placed on BiPAP therapy, which he currently remains. Emergency part course: Code sepsis initiated, source healthcare associated pneumonia   SUBJECTIVE: Doing well this morning, had lasix yesterday with good UOP.  Transferred to monitor unit this morning. States that breathing is better.   PAST MEDICAL HISTORY :  Past Medical History  Diagnosis Date  . Cerebrovascular disease   . Hypothyroidism   . GERD (gastroesophageal reflux disease)   . Generalized weakness   . Iron deficiency anemia   . Dysphagia following cerebrovascular disease   . 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 Vitamin D deficiency   . Constipation   . CAD  (coronary artery disease)     a. s/p CABG in 2010; b. nuc stress test 07/2013: no sig ischemia, large ant/apical/inf scar, EF 38%, normal study  . Ischemic cardiomyopathy     a. echo 01/2013: EF 25-35%, mile to mod AI  . Chronic systolic CHF (congestive heart failure)     a. echo 07/2013: 45-50%, mild biatrial enlargement, mild MR, mild aortic scl w/o stenosis, midl to mod AI, mild TR; b. echo 08/2015: EF 15-20%, mild AS, mild to mod MR, severe biatrial enlargement   . Stroke     a. residual right sided paralysis    History reviewed. No pertinent past surgical history. Prior to Admission medications   Medication Sig Start Date End Date Taking? Authorizing Provider  atorvastatin (LIPITOR) 40 MG tablet Take 40 mg by mouth daily.   Yes Historical Provider, MD  camphor-menthol Wynelle Fanny) lotion Apply 1 application topically 2 (two) times daily.   Yes Historical Provider, MD  clopidogrel (PLAVIX) 75 MG tablet Take 75 mg by mouth daily.   Yes Historical Provider, MD  docusate sodium (COLACE) 100 MG capsule Take 200 mg by mouth daily as needed for mild constipation.   Yes Historical Provider, MD  levothyroxine (SYNTHROID, LEVOTHROID) 100 MCG tablet Take 100 mcg by mouth daily before breakfast.   Yes Historical Provider, MD  sertraline (ZOLOFT) 100 MG tablet Take 100 mg by mouth daily.   Yes Historical Provider, MD  Vitamin D, Cholecalciferol, 1000 UNITS CAPS Take 1,000 Units by mouth daily.   Yes Historical Provider, MD  Vitamins A & D (VITAMIN A & D) ointment Apply 1 application topically See admin instructions. Apply to sacrum and buttocks daily and as needed after toileting   Yes Historical Provider, MD  Zinc Oxide (DESITIN) 13 % CREA Apply 1 application topically See admin instructions. Apply to buttocks twice daily and twice daily as needed for moisture barrier   Yes Historical Provider, MD   Allergies  Allergen Reactions  . Ambien [Zolpidem Tartrate]     FAMILY HISTORY:  Family History  Problem  Relation Age of Onset  . Diabetes Neg Hx    SOCIAL HISTORY:  has an unknown smoking status. He has quit using smokeless tobacco. He reports that he does not drink alcohol or use illicit drugs.  REVIEW OF SYSTEMS:   GEN - no fever, no chills RESP - mild sob, but improved CVS - no chest pain ABD - no abd pain MSK - no joint pain Ext. - no leg swelling   VITAL SIGNS: Temp:  [98.1 F (36.7 C)-98.6 F (37 C)] 98.3 F (36.8 C) (09/27 1610) Pulse Rate:  [78-92] 92 (09/27 0852) Resp:  [14-38] 20 (09/27 0608) BP: (102-135)/(48-80) 117/60 mmHg (09/27 0852) SpO2:  [91 %-100 %] 98 % (09/27 0608) Weight:  [182 lb 3.2 oz (82.645 kg)] 182 lb 3.2 oz (82.645 kg) (09/27 9604) HEMODYNAMICS:   VENTILATOR SETTINGS:   INTAKE / OUTPUT:  Intake/Output Summary (Last 24 hours) at 09/21/15 1108 Last data filed at 09/21/15 0300  Gross per 24 hour  Intake    850 ml  Output   3900 ml  Net  -3050 ml    Physical Examination:   VS: BP 117/60 mmHg  Pulse 92  Temp(Src) 98.3 F (36.8 C) (Oral)  Resp 20  Ht  (1.854 m)  Wt 182  lb 3.2 oz (82.645 kg)  BMI 24.04 kg/m2  SpO2 98%  General Appearance: No distress  Neuro:without focal findings, patient is lethargic and does not follow commands. HEENT: PERRLA, EOM intact, no ptosis, Pulmonary: Bilateral crackles noted, no wheezes noted. - improving today CardiovascularNormal S1,S2.  No m/r/g.    Abdomen: Benign, Soft, non-tender, No masses,  Renal:  No costovertebral tenderness  GU:  Not performed at this time. Endoc: No evident thyromegaly, no signs of acromegaly. Skin:   warm, no rashes, no ecchymosis  Extremities: normal, no cyanosis, clubbing, no edema,    LABS: Reviewed   LABORATORY PANEL:   CBC  Recent Labs Lab 09/21/15 0736  WBC 15.4*  HGB 8.9*  HCT 27.8*  PLT 166    Chemistries   Recent Labs Lab 09/19/15 2041  09/21/15 0736  NA 142  < > 146*  K 3.9  < > 2.7*  CL 105  < > 108  CO2 21*  < > 28  GLUCOSE 212*  < >  128*  BUN 16  < > 19  CREATININE 1.46*  < > 1.43*  CALCIUM 8.4*  < > 8.2*  MG  --   < > 1.7  PHOS  --   < > 3.2  AST 33  --   --   ALT 9*  --   --   ALKPHOS 78  --   --   BILITOT 1.2  --   --   < > = values in this interval not displayed.  No results for input(s): GLUCAP in the last 168 hours.  Recent Labs Lab 09/19/15 2039 09/20/15 0824  PHART 7.27* 7.39  PCO2ART 45 39  PO2ART 257* 151*    Recent Labs Lab 09/19/15 2041  AST 33  ALT 9*  ALKPHOS 78  BILITOT 1.2  ALBUMIN 3.3*    Cardiac Enzymes  Recent Labs Lab 09/20/15 1146  TROPONINI 0.08*    RADIOLOGY:  Dg Chest 1 View  09/21/2015   CLINICAL DATA:  Cough, shortness of breath, and congestion.  EXAM: CHEST 1 VIEW  COMPARISON:  09/20/2015  FINDINGS: Sequelae of prior CABG are again identified. Cardiac silhouette remains moderately enlarged. Mild pulmonary vascular congestion is unchanged with mild interstitial prominence and asymmetric right perihilar and right basilar opacities, overall similar to the prior study. No pleural effusion or pneumothorax is identified.  IMPRESSION: Unchanged cardiomegaly, mild pulmonary vascular congestion, and right greater than left perihilar and bibasilar opacities. Findings may reflect mild edema, although right lower lobe pneumonia is also possible.   Electronically Signed   By: Sebastian Ache M.D.   On: 09/21/2015 07:51   Dg Chest 1 View  09/20/2015   CLINICAL DATA:  Acute respiratory failure. Pulmonary edema. Shortness of breath.  EXAM: CHEST 1 VIEW  COMPARISON:  09/19/2015  FINDINGS: Prior CABG. Cardiomegaly with vascular congestion and mild interstitial prominence, likely interstitial edema. Bilateral lower lobe opacities also likely reflect edema. No visible effusions or acute bony abnormality.  IMPRESSION: Interstitial prominence with bilateral perihilar and lower lobe opacities most compatible with edema/ CHF.   Electronically Signed   By: Charlett Nose M.D.   On: 09/20/2015 10:13    Dg Chest Portable 1 View  09/19/2015   CLINICAL DATA:  Shortness of breath for 1 day  EXAM: PORTABLE CHEST - 1 VIEW  COMPARISON:  03/09/2014  FINDINGS: Cardiac shadow is enlarged. Postsurgical changes are again seen. The lungs are well aerated bilaterally. Some new patchy infiltrate is noted in  the right lung base. No bony abnormality is seen.  IMPRESSION: New right basilar infiltrate.   Electronically Signed   By: Alcide Clever M.D.   On: 09/19/2015 21:23     Pulmonary Consult time - 40 mins   Stephanie Acre, MD [facility] Pager [phone] (please enter 7-digits) On Call Pager - [phone] (please enter 7-digits)    09/21/2015, 11:08 AM

## 2015-09-21 NOTE — Progress Notes (Signed)
Pharmacy Consult for Electrolyte Management  Allergies  Allergen Reactions  . Ambien [Zolpidem Tartrate]     Patient Measurements: Height:  (185.4 cm) Weight: 182 lb 3.2 oz (82.645 kg) IBW/kg (Calculated) : 79.9   Vital Signs: Temp: 98.2 F (36.8 C) (09/27 1108) Temp Source: Oral (09/27 1108) BP: 121/62 mmHg (09/27 1108) Pulse Rate: 91 (09/27 1108) Intake/Output from previous day: 09/26 0701 - 09/27 0700 In: 1200 [IV Piggyback:1200] Out: 4500 [Urine:4500] Intake/Output from this shift: Total I/O In: -  Out: 1501 [Urine:1500; Stool:1]  Labs:  Recent Labs  09/19/15 2041 09/20/15 0539 09/21/15 0736  WBC 15.7* 17.3* 15.4*  HGB 10.5* 9.6* 8.9*  HCT 33.8* 29.9* 27.8*  PLT 272 165 166  APTT 29  --   --   INR 1.34  --   --      Recent Labs  09/19/15 2041 09/20/15 0539 09/20/15 1146 09/20/15 1352 09/20/15 2104 09/21/15 0736  NA 142 144  --   --   --  146*  K 3.9 2.2*  --  2.6* 3.0* 2.7*  CL 105 111  --   --   --  108  CO2 21* 25  --   --   --  28  GLUCOSE 212* 145*  --   --   --  128*  BUN 16 18  --   --   --  19  CREATININE 1.46* 1.39*  --   --   --  1.43*  CALCIUM 8.4* 8.0*  --   --   --  8.2*  MG  --   --  1.7  --   --  1.7  PHOS  --   --  3.1  --   --  3.2  PROT 6.9  --   --   --   --   --   ALBUMIN 3.3*  --   --   --   --   --   AST 33  --   --   --   --   --   ALT 9*  --   --   --   --   --   ALKPHOS 78  --   --   --   --   --   BILITOT 1.2  --   --   --   --   --    Estimated Creatinine Clearance: 50.4 mL/min (by C-G formula based on Cr of 1.43).   No results for input(s): GLUCAP in the last 72 hours.  Medical History: Past Medical History  Diagnosis Date  . Cerebrovascular disease   . Hypothyroidism   . GERD (gastroesophageal reflux disease)   . Generalized weakness   . Iron deficiency anemia   . Dysphagia following cerebrovascular disease   . Hemiplegia and hemiparesis     following cerebrovascular disease affecting right  dominant side  . Vitamin D deficiency   . Constipation   . CAD (coronary artery disease)     a. s/p CABG in 2010; b. nuc stress test 07/2013: no sig ischemia, large ant/apical/inf scar, EF 38%, normal study  . Ischemic cardiomyopathy     a. echo 01/2013: EF 25-35%, mile to mod AI  . Chronic systolic CHF (congestive heart failure)     a. echo 07/2013: 45-50%, mild biatrial enlargement, mild MR, mild aortic scl w/o stenosis, midl to mod AI, mild TR; b. echo 08/2015: EF 15-20%, mild AS, mild to mod MR, severe biatrial enlargement   .  Stroke     a. residual right sided paralysis   . Smoker     former    Medications:  Scheduled:  . atorvastatin  40 mg Oral Daily  . camphor-menthol  1 application Topical BID  . cholecalciferol  1,000 Units Oral Daily  . clopidogrel  75 mg Oral Daily  . furosemide  40 mg Intravenous BID  . heparin  5,000 Units Subcutaneous 3 times per day  . ipratropium-albuterol  3 mL Nebulization Q4H  . levothyroxine  100 mcg Oral QAC breakfast  . piperacillin-tazobactam (ZOSYN)  IV  3.375 g Intravenous 3 times per day  . potassium chloride  20 mEq Oral TID  . sertraline  100 mg Oral Daily  . vancomycin  1,250 mg Intravenous Q18H  . vitamin A & D  1 application Topical See admin instructions  . zinc oxide  1 application Topical See admin instructions   Infusions:   PRN: acetaminophen **OR** acetaminophen, docusate sodium, morphine injection, ondansetron **OR** ondansetron (ZOFRAN) IV, oxyCODONE, sodium chloride  Assessment: Pharmacy consulted to assist in managing electrolytes in this 74 y/o M with sepsis. Potassium remains low at 2.7 this AM.   MD ordered KCl 20 meq TID.  Patient received 40 meq of KCl at 0923.  Of note, patient is receiving Lasix 40 mg IV q12h.    Mag level wnl.   Plan:  Next dose of KCl 20 mEq scheduled for 1600.  Will recheck a follow up potassium at 1800 after this dose to assess need for higher doses of KCl.  Pharmacy will continue to  follow.  Scarpena,Crystal G 09/21/2015,2:16 PM

## 2015-09-21 NOTE — Progress Notes (Signed)
Pharmacy Consult for Electrolyte Management  Allergies  Allergen Reactions  . Ambien [Zolpidem Tartrate]     Patient Measurements: Height:  (185.4 cm) Weight: 182 lb 3.2 oz (82.645 kg) IBW/kg (Calculated) : 79.9   Vital Signs: Temp: 98.2 F (36.8 C) (09/27 1108) Temp Source: Oral (09/27 1108) BP: 129/69 mmHg (09/27 1811) Pulse Rate: 93 (09/27 1811) Intake/Output from previous day: 09/26 0701 - 09/27 0700 In: 1200 [IV Piggyback:1200] Out: 4500 [Urine:4500] Intake/Output from this shift:    Labs:  Recent Labs  09/19/15 2041 09/20/15 0539 09/21/15 0736  WBC 15.7* 17.3* 15.4*  HGB 10.5* 9.6* 8.9*  HCT 33.8* 29.9* 27.8*  PLT 272 165 166  APTT 29  --   --   INR 1.34  --   --      Recent Labs  09/19/15 2041 09/20/15 0539 09/20/15 1146  09/20/15 2104 09/21/15 0736 09/21/15 1808  NA 142 144  --   --   --  146*  --   K 3.9 2.2*  --   < > 3.0* 2.7* 3.0*  CL 105 111  --   --   --  108  --   CO2 21* 25  --   --   --  28  --   GLUCOSE 212* 145*  --   --   --  128*  --   BUN 16 18  --   --   --  19  --   CREATININE 1.46* 1.39*  --   --   --  1.43*  --   CALCIUM 8.4* 8.0*  --   --   --  8.2*  --   MG  --   --  1.7  --   --  1.7  --   PHOS  --   --  3.1  --   --  3.2  --   PROT 6.9  --   --   --   --   --   --   ALBUMIN 3.3*  --   --   --   --   --   --   AST 33  --   --   --   --   --   --   ALT 9*  --   --   --   --   --   --   ALKPHOS 78  --   --   --   --   --   --   BILITOT 1.2  --   --   --   --   --   --   < > = values in this interval not displayed. Estimated Creatinine Clearance: 50.4 mL/min (by C-G formula based on Cr of 1.43).   No results for input(s): GLUCAP in the last 72 hours.  Medical History: Past Medical History  Diagnosis Date  . Cerebrovascular disease   . Hypothyroidism   . GERD (gastroesophageal reflux disease)   . Generalized weakness   . Iron deficiency anemia   . Dysphagia following cerebrovascular disease   . 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 Vitamin D deficiency   . Constipation   . CAD (coronary artery disease)     a. s/p CABG in 2010; b. nuc stress test 07/2013: no sig ischemia, large ant/apical/inf scar, EF 38%, normal study  . Ischemic cardiomyopathy     a. echo 01/2013: EF 25-35%, mile  to mod AI  . Chronic systolic CHF (congestive heart failure)     a. echo 07/2013: 45-50%, mild biatrial enlargement, mild MR, mild aortic scl w/o stenosis, midl to mod AI, mild TR; b. echo 08/2015: EF 15-20%, mild AS, mild to mod MR, severe biatrial enlargement   . Stroke     a. residual right sided paralysis   . Smoker     former    Medications:  Scheduled:  . atorvastatin  40 mg Oral Daily  . camphor-menthol  1 application Topical BID  . cholecalciferol  1,000 Units Oral Daily  . clopidogrel  75 mg Oral Daily  . enoxaparin (LOVENOX) injection  40 mg Subcutaneous Q24H  . furosemide  40 mg Intravenous BID  . ipratropium-albuterol  3 mL Nebulization Q4H  . levothyroxine  100 mcg Oral QAC breakfast  . piperacillin-tazobactam (ZOSYN)  IV  3.375 g Intravenous 3 times per day  . potassium chloride  20 mEq Oral TID  . sertraline  100 mg Oral Daily  . vancomycin  1,250 mg Intravenous Q18H  . vitamin A & D  1 application Topical See admin instructions  . zinc oxide  1 application Topical See admin instructions   Infusions:   PRN: acetaminophen **OR** acetaminophen, docusate sodium, morphine injection, ondansetron **OR** ondansetron (ZOFRAN) IV, oxyCODONE, sodium chloride  Assessment: Pharmacy consulted to assist in managing electrolytes in this 74 y/o M with sepsis. Potassium remains low at 2.7 this AM.   MD ordered KCl 20 meq TID.  Patient received 40 meq of KCl at 0923.  Of note, patient is receiving Lasix 40 mg IV q12h.    Mag level wnl.   Plan:  Next dose of KCl 20 mEq scheduled for 1600.  Will recheck a follow up potassium at 1800 after this  dose to assess need for higher doses of KCl. K level is 3.0 @ 1800. Pt received a total of 60 Meq today. Will give another tonight and recheck level in AM. Will discontinue the scheduled dose, in the AM will have a better idea of pt K needs  Pharmacy will continue to follow.  Laurene Footman Maccia 09/21/2015,7:04 PM

## 2015-09-21 NOTE — Care Management (Signed)
Patient presents from [facility] for shortness of breath.  Admitted for sepsis due to pneumonia and UTI.  Patient is followed by [facility].  Initially required admission to icu and placed bipap.  Transferred to 2A 9/26.  Reached out to [facility]

## 2015-09-21 NOTE — Progress Notes (Signed)
[facility] [place] at [facility]   PATIENT NAME: Samuel Oneill    MR#:  [id number]  DATE OF BIRTH:  12/30/1940  SUBJECTIVE:  CHIEF COMPLAINT:   Chief Complaint  Patient presents with  . Shortness of Breath  The patient is awake but confused, home oxygen by nasal cannular 4 L, off BIPAP since yesterday.  REVIEW OF SYSTEMS:  Unable to get ROS.   DRUG ALLERGIES:   Allergies  Allergen Reactions  . Ambien [Zolpidem Tartrate]     VITALS:  Blood pressure 121/62, pulse 91, temperature 98.2 F (36.8 C), temperature source Oral, resp. rate 19, height  (1.854 m), weight 82.645 kg (182 lb 3.2 oz), SpO2 97 %.  PHYSICAL EXAMINATION:  GENERAL:  74 y.o.-year-old patient lying in the bed, on BIPAP. EYES: Pupils equal, round, reactive to light and accommodation. No scleral icterus.  HEENT: Head atraumatic, normocephalic.  NECK:  Supple, no jugular venous distention. No thyroid enlargement, no tenderness.  LUNGS: Normal breath sounds bilaterally, no wheezing, bilateral crackles are better. No use of accessory muscles of respiration.  CARDIOVASCULAR: S1, S2 normal. No murmurs, rubs, or gallops.  ABDOMEN: Soft, nontender, nondistended. Bowel sounds present. No organomegaly or mass.  EXTREMITIES: Bilateral leg edema 1+, no cyanosis, or clubbing.  NEUROLOGIC: Cranial nerves II through XII are intact. Muscle strength 3/5 in all extremities. Sensation intact. Gait not checked.  PSYCHIATRIC: The patient is AAO 2. SKIN: No obvious rash, lesion, sacral DU stage 2.    LABORATORY PANEL:   CBC  Recent Labs Lab 09/21/15 0736  WBC 15.4*  HGB 8.9*  HCT 27.8*  PLT 166   ------------------------------------------------------------------------------------------------------------------  Chemistries   Recent Labs Lab 09/19/15 2041  09/21/15 0736  NA 142  < > 146*  K 3.9  < > 2.7*  CL 105  < > 108  CO2 21*  < > 28  GLUCOSE 212*  < > 128*  BUN 16  < > 19   CREATININE 1.46*  < > 1.43*  CALCIUM 8.4*  < > 8.2*  MG  --   < > 1.7  AST 33  --   --   ALT 9*  --   --   ALKPHOS 78  --   --   BILITOT 1.2  --   --   < > = values in this interval not displayed. ------------------------------------------------------------------------------------------------------------------  Cardiac Enzymes  Recent Labs Lab 09/20/15 1146  TROPONINI 0.08*   ------------------------------------------------------------------------------------------------------------------  RADIOLOGY:  Dg Chest 1 View  09/21/2015   CLINICAL DATA:  Cough, shortness of breath, and congestion.  EXAM: CHEST 1 VIEW  COMPARISON:  09/20/2015  FINDINGS: Sequelae of prior CABG are again identified. Cardiac silhouette remains moderately enlarged. Mild pulmonary vascular congestion is unchanged with mild interstitial prominence and asymmetric right perihilar and right basilar opacities, overall similar to the prior study. No pleural effusion or pneumothorax is identified.  IMPRESSION: Unchanged cardiomegaly, mild pulmonary vascular congestion, and right greater than left perihilar and bibasilar opacities. Findings may reflect mild edema, although right lower lobe pneumonia is also possible.   Electronically Signed   By: Sebastian Ache M.D.   On: 09/21/2015 07:51   Dg Chest 1 View  09/20/2015   CLINICAL DATA:  Acute respiratory failure. Pulmonary edema. Shortness of breath.  EXAM: CHEST 1 VIEW  COMPARISON:  09/19/2015  FINDINGS: Prior CABG. Cardiomegaly with vascular congestion and mild interstitial prominence, likely interstitial edema. Bilateral lower lobe opacities also likely reflect edema. No visible  effusions or acute bony abnormality.  IMPRESSION: Interstitial prominence with bilateral perihilar and lower lobe opacities most compatible with edema/ CHF.   Electronically Signed   By: Charlett Nose M.D.   On: 09/20/2015 10:13   Dg Chest Portable 1 View  09/19/2015   CLINICAL DATA:  Shortness of  breath for 1 day  EXAM: PORTABLE CHEST - 1 VIEW  COMPARISON:  03/09/2014  FINDINGS: Cardiac shadow is enlarged. Postsurgical changes are again seen. The lungs are well aerated bilaterally. Some new patchy infiltrate is noted in the right lung base. No bony abnormality is seen.  IMPRESSION: New right basilar infiltrate.   Electronically Signed   By: Alcide Clever M.D.   On: 09/19/2015 21:23    EKG:   Orders placed or performed during the hospital encounter of 09/19/15  . ED EKG  . ED EKG  . EKG 12-Lead  . EKG 12-Lead  . EKG 12-Lead  . EKG 12-Lead  . EKG 12-Lead  . EKG 12-Lead  . EKG 12-Lead  . EKG 12-Lead  . EKG 12-Lead  . EKG 12-Lead    ASSESSMENT AND PLAN:   1. Severe Sepsis and healthcare associated pneumonia, Continue vancomycin/Zosyn, f/u panculture.  leukocytosis is better.  2. Acute respiratory failure with hypoxia:  Try to wean off oxygen, continue DuoNeb.  3. ARF. Treated with NS iv which was discontinued due toacute on chronic systolic CHF.  F/u BMP.  4. Acute on chronic systolic CHF (EF 04% -  20%),  Continue lasix 40 mg bid per cardiology consult. Unable to start ACE inhibitor due to acute renal failure.  * new-onset A. Fib today. Per Dr. Mariah Milling, --In terms of anticoagulation for his atrial fibrillation, would probably hold off for now unless he has frequent sustained runs. Unclear if he is a good candidate for anticoagulation.  5. Severe Hypokalemia. improving, continue potassium supplement and follow-up BMP.  * elevated troponin, due to demand ischemia. Continue plavix and lipitor. * sacral DU stage 2.  Wound care.   I discussed with cardiology PA. All the records are reviewed and case discussed with Care Management/Social Workerr. Management plans discussed with the patient's POA, Ms. Irving Burton and she is in agreement.  CODE STATUS: Full Code  TOTAL TIME TAKING CARE OF THIS PATIENT: 47 minutes.   POSSIBLE D/C IN >3 DAYS, DEPENDING ON CLINICAL  CONDITION.   Shaune Pollack M.D on 09/21/2015 at 1:57 PM  Between 7am to 6pm - Pager - [phone]  After 6pm go to [email or web address] - password [email or web address] [facility]  [facility]  Office  [phone]  CC: Primary care physician; Bobbye Morton, MD

## 2015-09-21 NOTE — Progress Notes (Signed)
Pt to be transferred to 2A. Report called to [place], [employer]

## 2015-09-21 NOTE — Care Management Important Message (Signed)
Important Message  Patient Details  Name: Samuel Oneill MRN: [id number] Date of Birth: [birth year]   Medicare Important Message Given:  Yes-second notification given    Verita Schneiders Allmond 09/21/2015, 9:28 AM

## 2015-09-22 DIAGNOSIS — I255 Ischemic cardiomyopathy: Secondary | ICD-10-CM

## 2015-09-22 LAB — BASIC METABOLIC PANEL
Anion gap: 12 (ref 5–15)
BUN: 18 mg/dL (ref 6–20)
CALCIUM: 8.4 mg/dL — AB (ref 8.9–10.3)
CO2: 31 mmol/L (ref 22–32)
CREATININE: 1.35 mg/dL — AB (ref 0.61–1.24)
Chloride: 98 mmol/L — ABNORMAL LOW (ref 101–111)
GFR calc non Af Amer: 50 mL/min — ABNORMAL LOW (ref >60)
GFR, EST AFRICAN AMERICAN: 58 mL/min — AB (ref >60)
Glucose, Bld: 133 mg/dL — ABNORMAL HIGH (ref 65–99)
Potassium: 2.9 mmol/L — CL (ref 3.5–5.1)
SODIUM: 141 mmol/L (ref 135–145)

## 2015-09-22 LAB — VANCOMYCIN, TROUGH: VANCOMYCIN TR: 46 ug/mL — AB (ref 10–20)

## 2015-09-22 LAB — CBC
HEMATOCRIT: 27.6 % — AB (ref 40.0–52.0)
Hemoglobin: 8.7 g/dL — ABNORMAL LOW (ref 13.0–18.0)
MCH: 28.6 pg (ref 26.0–34.0)
MCHC: 31.6 g/dL — AB (ref 32.0–36.0)
MCV: 90.6 fL (ref 80.0–100.0)
Platelets: 177 10*3/uL (ref 150–440)
RBC: 3.04 MIL/uL — ABNORMAL LOW (ref 4.40–5.90)
RDW: 17.1 % — AB (ref 11.5–14.5)
WBC: 12.6 10*3/uL — ABNORMAL HIGH (ref 3.8–10.6)

## 2015-09-22 LAB — POTASSIUM: Potassium: 2.8 mmol/L — CL (ref 3.5–5.1)

## 2015-09-22 LAB — MAGNESIUM: Magnesium: 1.8 mg/dL (ref 1.7–2.4)

## 2015-09-22 MED ORDER — POTASSIUM CHLORIDE 10 MEQ/100ML IV SOLN
10.0000 meq | INTRAVENOUS | Status: DC
  Filled 2015-09-22 (×4): qty 100

## 2015-09-22 MED ORDER — FUROSEMIDE 10 MG/ML IJ SOLN: 20.0000 mg | Freq: Every day | INTRAMUSCULAR | Status: DC

## 2015-09-22 MED ORDER — POTASSIUM CHLORIDE CRYS ER 20 MEQ PO TBCR
40.0000 meq | EXTENDED_RELEASE_TABLET | Freq: Three times a day (TID) | ORAL | Status: DC
  Administered 2015-09-22 – 2015-09-23 (×2): 40 meq via ORAL
  Filled 2015-09-22 (×2): qty 2

## 2015-09-22 MED ORDER — POTASSIUM CHLORIDE 10 MEQ/100ML IV SOLN
10.0000 meq | INTRAVENOUS | Status: AC
  Administered 2015-09-22 (×6): 10 meq via INTRAVENOUS
  Filled 2015-09-22 (×6): qty 100

## 2015-09-22 MED ORDER — POTASSIUM CHLORIDE CRYS ER 20 MEQ PO TBCR
40.0000 meq | EXTENDED_RELEASE_TABLET | Freq: Two times a day (BID) | ORAL | Status: DC
  Administered 2015-09-22: 40 meq via ORAL
  Filled 2015-09-22: qty 2

## 2015-09-22 MED ORDER — AMOXICILLIN-POT CLAVULANATE 875-125 MG PO TABS
1.0000 | ORAL_TABLET | Freq: Two times a day (BID) | ORAL | Status: DC
  Administered 2015-09-22 – 2015-09-24 (×4): 1 via ORAL
  Filled 2015-09-22 (×5): qty 1

## 2015-09-22 NOTE — Care Management (Signed)
Lasix dose decreased.  Cardiology will consider cardiac cath when patient when patient status has improved- perhaps as an outpatient.  Unable to start beta blocker and or ace/Entresto due to respiratory and renal function.  Attending hope to discharge back to skilled nursing within next 24-48 hours

## 2015-09-22 NOTE — Progress Notes (Addendum)
Pharmacy Consult for Electrolyte Management  Allergies  Allergen Reactions  . Ambien [Zolpidem Tartrate]     Patient Measurements: Height:  (185.4 cm) Weight: 182 lb 3.2 oz (82.645 kg) IBW/kg (Calculated) : 79.9   Vital Signs: Temp: 98.5 F (36.9 C) (09/28 0453) Temp Source: Oral (09/28 0453) BP: 113/63 mmHg (09/28 0453) Pulse Rate: 88 (09/28 0453) Intake/Output from previous day: 09/27 0701 - 09/28 0700 In: 830 [P.O.:480; IV Piggyback:350] Out: 4255 [Urine:4251; Stool:4] Intake/Output from this shift: Total I/O In: 240 [P.O.:240] Out: 1 [Stool:1]  Labs:  Recent Labs  09/19/15 2041 09/20/15 0539 09/21/15 0736 09/22/15 0420  WBC 15.7* 17.3* 15.4* 12.6*  HGB 10.5* 9.6* 8.9* 8.7*  HCT 33.8* 29.9* 27.8* 27.6*  PLT 272 165 166 177  APTT 29  --   --   --   INR 1.34  --   --   --      Recent Labs  09/19/15 2041 09/20/15 0539 09/20/15 1146  09/21/15 0736 09/21/15 1808 09/22/15 0420  NA 142 144  --   --  146*  --  141  K 3.9 2.2*  --   < > 2.7* 3.0* 2.9*  CL 105 111  --   --  108  --  98*  CO2 21* 25  --   --  28  --  31  GLUCOSE 212* 145*  --   --  128*  --  133*  BUN 16 18  --   --  19  --  18  CREATININE 1.46* 1.39*  --   --  1.43*  --  1.35*  CALCIUM 8.4* 8.0*  --   --  8.2*  --  8.4*  MG  --   --  1.7  --  1.7  --  1.8  PHOS  --   --  3.1  --  3.2  --   --   PROT 6.9  --   --   --   --   --   --   ALBUMIN 3.3*  --   --   --   --   --   --   AST 33  --   --   --   --   --   --   ALT 9*  --   --   --   --   --   --   ALKPHOS 78  --   --   --   --   --   --   BILITOT 1.2  --   --   --   --   --   --   < > = values in this interval not displayed. Estimated Creatinine Clearance: 53.4 mL/min (by C-G formula based on Cr of 1.35).   No results for input(s): GLUCAP in the last 72 hours.  Medical History: Past Medical History  Diagnosis Date  . Cerebrovascular disease   . Hypothyroidism   . GERD (gastroesophageal reflux disease)   . Generalized  weakness   . Iron deficiency anemia   . Dysphagia following cerebrovascular disease   . 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 Vitamin D deficiency   . Constipation   . CAD (coronary artery disease)     a. s/p CABG in 2010; b. nuc stress test 07/2013: no sig ischemia, large ant/apical/inf scar, EF 38%, normal study  . Ischemic cardiomyopathy     a. echo  01/2013: EF 25-35%, mile to mod AI  . Chronic systolic CHF (congestive heart failure)     a. echo 07/2013: 45-50%, mild biatrial enlargement, mild MR, mild aortic scl w/o stenosis, midl to mod AI, mild TR; b. echo 08/2015: EF 15-20%, mild AS, mild to mod MR, severe biatrial enlargement   . Stroke     a. residual right sided paralysis   . Smoker     former    Medications:  Scheduled:  . atorvastatin  40 mg Oral Daily  . camphor-menthol  1 application Topical BID  . cholecalciferol  1,000 Units Oral Daily  . clopidogrel  75 mg Oral Daily  . enoxaparin (LOVENOX) injection  40 mg Subcutaneous Q24H  . furosemide  20 mg Intravenous Daily  . ipratropium-albuterol  3 mL Nebulization BID  . levothyroxine  100 mcg Oral QAC breakfast  . piperacillin-tazobactam (ZOSYN)  IV  3.375 g Intravenous 3 times per day  . potassium chloride  40 mEq Oral BID  . sertraline  100 mg Oral Daily  . vancomycin  1,250 mg Intravenous Q18H  . vitamin A & D  1 application Topical See admin instructions  . zinc oxide  1 application Topical See admin instructions   Infusions:   PRN: acetaminophen **OR** acetaminophen, docusate sodium, morphine injection, ondansetron **OR** ondansetron (ZOFRAN) IV, oxyCODONE, sodium chloride  Assessment: Pharmacy consulted to assist in managing electrolytes in this 74 y/o M with sepsis. Potassium remains low at 2.9 this AM.   MD ordered KCl 40 meq BID this AM.  Patient received 60 meq of KCl on 9/27.  Per MAR, patient refused KCl 40 meq dose ordered by RPh on evening of  9/27. Of note, patient is receiving Lasix 20 mg IV q24h.    Patient received 40 meq of KCl at 0700  Mag level wnl.   Plan:  Will continue potassium chloride 40 meq po BID.  Will check a potassium level at 1300 to assess for more frequent potassium dosing (i.e. TID).   Pharmacy will continue to follow.  Scarpena,Crystal G, Pharm.D. Clinical Pharmacist 09/22/2015,11:34 AM

## 2015-09-22 NOTE — Progress Notes (Signed)
ANTIBIOTIC CONSULT NOTE - Follow Up  Pharmacy Consult for vancomycin and Zosyn dosing Indication: HCAP/sepsis  Allergies  Allergen Reactions  . Ambien [Zolpidem Tartrate]     Patient Measurements: Height:  (185.4 cm) Weight: 182 lb 3.2 oz (82.645 kg) IBW/kg (Calculated) : 79.9 Adjusted Body Weight: 86kg  Vital Signs: Temp: 98.5 F (36.9 C) (09/28 0453) Temp Source: Oral (09/28 0453) BP: 113/63 mmHg (09/28 0453) Pulse Rate: 88 (09/28 0453) Intake/Output from previous day: 09/27 0701 - 09/28 0700 In: 830 [P.O.:480; IV Piggyback:350] Out: 4255 [Urine:4251; Stool:4] Intake/Output from this shift: Total I/O In: 240 [P.O.:240] Out: 1 [Stool:1]  Labs:  Recent Labs  09/20/15 0539 09/21/15 0736 09/22/15 0420  WBC 17.3* 15.4* 12.6*  HGB 9.6* 8.9* 8.7*  PLT 165 166 177  CREATININE 1.39* 1.43* 1.35*   Estimated Creatinine Clearance: 53.4 mL/min (by C-G formula based on Cr of 1.35).  Recent Labs  09/22/15 0932  VANCOTROUGH 46*     Microbiology: Recent Results (from the past 720 hour(s))  Blood culture (routine x 2)     Status: None (Preliminary result)   Collection Time: 09/19/15  8:35 PM  Result Value Ref Range Status   Specimen Description BLOOD LEFT ASSIST CONTROL  Final   Special Requests BOTTLES DRAWN AEROBIC AND ANAEROBIC 4CC  Final   Culture NO GROWTH 3 DAYS  Final   Report Status PENDING  Incomplete  Blood culture (routine x 2)     Status: None (Preliminary result)   Collection Time: 09/19/15  8:45 PM  Result Value Ref Range Status   Specimen Description BLOOD LEFT WRIST  Final   Special Requests BOTTLES DRAWN AEROBIC AND ANAEROBIC 3CC  Final   Culture NO GROWTH 3 DAYS  Final   Report Status PENDING  Incomplete  Urine culture     Status: None   Collection Time: 09/19/15  9:01 PM  Result Value Ref Range Status   Specimen Description URINE, RANDOM  Final   Special Requests NONE  Final   Culture NO GROWTH 1 DAY  Final   Report Status 09/21/2015  FINAL  Final  MRSA PCR Screening     Status: None   Collection Time: 09/19/15 10:42 PM  Result Value Ref Range Status   MRSA by PCR NEGATIVE NEGATIVE Final    Comment:        The GeneXpert MRSA Assay (FDA approved for NASAL specimens only), is one component of a comprehensive MRSA colonization surveillance program. It is not intended to diagnose MRSA infection nor to guide or monitor treatment for MRSA infections.     Medical History: Past Medical History  Diagnosis Date  . Cerebrovascular disease   . Hypothyroidism   . GERD (gastroesophageal reflux disease)   . Generalized weakness   . Iron deficiency anemia   . Dysphagia following cerebrovascular disease   . 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 Vitamin D deficiency   . Constipation   . CAD (coronary artery disease)     a. s/p CABG in 2010; b. nuc stress test 07/2013: no sig ischemia, large ant/apical/inf scar, EF 38%, normal study  . Ischemic cardiomyopathy     a. echo 01/2013: EF 25-35%, mile to mod AI  . Chronic systolic CHF (congestive heart failure)     a. echo 07/2013: 45-50%, mild biatrial enlargement, mild MR, mild aortic scl w/o stenosis, midl to mod AI, mild TR; b. echo 08/2015: EF 15-20%, mild AS, mild  to mod MR, severe biatrial enlargement   . Stroke     a. residual right sided paralysis   . Smoker     former    Medications:  Vancomycin 1250 mg IV q18h Zosyn 3.375 gm IV q8h per EI protocol  Assessment: Patient is a 74 yo male with orders for Vancomycin 1250 mg IV q18h and Zosyn 3.375 gm IV q8h per EI protocol.   SCr: 1.35, est CrCl~54 mL/min, ke: 0.049, t1/2: 14 h, Vd: 56.7 L  BCx: NGTD  Vanc Trough at 0932: 46 (drawn ~30 minutes after infusion started)   Goal of Therapy:  Vancomycin trough level 15-20 mcg/ml  Plan:  Vancomycin level resulted at 46.  This trough was drawn during the Vancomycin infusion which makes this level unreliable.   Dose of Vancomycin 1250 mg IV already infused.  Renal function actually improving.  Therefore, will check a trough prior to next dose at 0330.  MAR note left to instruct RN to please hold off on infusing dose prior to trough level.    Continue Zosyn 3.375 grams q 8 hours ordered.  Clarisa Schools, PharmD Clinical Pharmacist 09/22/2015

## 2015-09-22 NOTE — Progress Notes (Signed)
Patient: Samuel Oneill / Admit Date: 09/19/2015 / Date of Encounter: 09/22/2015, 9:56 AM   Subjective: Feeling better today. cough   Nonproductive Low potassium despite supplement yesterday Stable renal function,  Telemetry reviewed, no significant arrhythmia overnight Leg swelling improved  Review of Systems: Review of Systems  Constitutional: Positive for malaise/fatigue. Negative for fever, chills and diaphoresis.  Respiratory: Positive for cough, shortness of breath and wheezing. Negative for hemoptysis and sputum production.   Cardiovascular: Negative for chest pain, palpitations, orthopnea, claudication, leg swelling and PND.  Gastrointestinal: Negative for nausea and vomiting.  Musculoskeletal: Negative for falls.  Skin: Negative for rash.  Neurological: Positive for weakness. Negative for sensory change, speech change, focal weakness and loss of consciousness.     Objective: Telemetry: Normal sinus rhythm Physical Exam: Blood pressure 113/63, pulse 88, temperature 98.5 F (36.9 C), temperature source Oral, resp. rate 18, height  (1.854 m), weight 182 lb 3.2 oz (82.645 kg), SpO2 95 %. Body mass index is 24.04 kg/(m^2). General: Well developed, well nourished, in no acute distress. Head: Normocephalic, atraumatic, sclera non-icteric, no xanthomas, nares are without discharge. Neck: Negative for carotid bruits. JVP not elevated. Lungs: Decreased breath sounds with crackles at the bilateral bases. Breathing is unlabored. Heart: RRR S1 S2. II/VI systolic murmurs. No rubs or gallops.  Abdomen: Soft, non-tender, non-distended with normoactive bowel sounds. No rebound/guarding. Extremities: No clubbing or cyanosis. No edema. Distal pedal pulses are 2+ and equal bilaterally. Neuro: Alert and oriented X 3. Moves all extremities spontaneously. Psych:  Responds to questions appropriately with a normal affect.   Intake/Output Summary (Last 24 hours) at 09/22/15 0956 Last  data filed at 09/22/15 0900  Gross per 24 hour  Intake    830 ml  Output   4256 ml  Net  -3426 ml    Inpatient Medications:  . atorvastatin  40 mg Oral Daily  . camphor-menthol  1 application Topical BID  . cholecalciferol  1,000 Units Oral Daily  . clopidogrel  75 mg Oral Daily  . enoxaparin (LOVENOX) injection  40 mg Subcutaneous Q24H  . furosemide  40 mg Intravenous BID  . ipratropium-albuterol  3 mL Nebulization BID  . levothyroxine  100 mcg Oral QAC breakfast  . piperacillin-tazobactam (ZOSYN)  IV  3.375 g Intravenous 3 times per day  . potassium chloride  40 mEq Oral BID  . sertraline  100 mg Oral Daily  . vancomycin  1,250 mg Intravenous Q18H  . vitamin A & D  1 application Topical See admin instructions  . zinc oxide  1 application Topical See admin instructions   Infusions:    Labs:  Recent Labs  09/20/15 1146  09/21/15 0736 09/21/15 1808 09/22/15 0420  NA  --   --  146*  --  141  K  --   < > 2.7* 3.0* 2.9*  CL  --   --  108  --  98*  CO2  --   --  28  --  31  GLUCOSE  --   --  128*  --  133*  BUN  --   --  19  --  18  CREATININE  --   --  1.43*  --  1.35*  CALCIUM  --   --  8.2*  --  8.4*  MG 1.7  --  1.7  --  1.8  PHOS 3.1  --  3.2  --   --   < > = values in  this interval not displayed.  Recent Labs  09/19/15 2041  AST 33  ALT 9*  ALKPHOS 78  BILITOT 1.2  PROT 6.9  ALBUMIN 3.3*    Recent Labs  09/21/15 0736 09/22/15 0420  WBC 15.4* 12.6*  HGB 8.9* 8.7*  HCT 27.8* 27.6*  MCV 92.2 90.6  PLT 166 177    Recent Labs  09/19/15 2041 09/19/15 2339 09/20/15 0539 09/20/15 1146  TROPONINI 0.07* 0.08* 0.09* 0.08*   Invalid input(s): POCBNP No results for input(s): HGBA1C in the last 72 hours.   Weights: Filed Weights   09/19/15 2114 09/19/15 2208 09/21/15 0608  Weight: 191 lb 6.4 oz (86.818 kg) 188 lb 0.8 oz (85.3 kg) 182 lb 3.2 oz (82.645 kg)     Radiology/Studies:  Dg Chest 1 View  09/21/2015   CLINICAL DATA:  Cough, shortness of  breath, and congestion.  EXAM: CHEST 1 VIEW  COMPARISON:  09/20/2015  FINDINGS: Sequelae of prior CABG are again identified. Cardiac silhouette remains moderately enlarged. Mild pulmonary vascular congestion is unchanged with mild interstitial prominence and asymmetric right perihilar and right basilar opacities, overall similar to the prior study. No pleural effusion or pneumothorax is identified.  IMPRESSION: Unchanged cardiomegaly, mild pulmonary vascular congestion, and right greater than left perihilar and bibasilar opacities. Findings may reflect mild edema, although right lower lobe pneumonia is also possible.   Electronically Signed   By: Sebastian Ache M.D.   On: 09/21/2015 07:51   Dg Chest 1 View  09/20/2015   CLINICAL DATA:  Acute respiratory failure. Pulmonary edema. Shortness of breath.  EXAM: CHEST 1 VIEW  COMPARISON:  09/19/2015  FINDINGS: Prior CABG. Cardiomegaly with vascular congestion and mild interstitial prominence, likely interstitial edema. Bilateral lower lobe opacities also likely reflect edema. No visible effusions or acute bony abnormality.  IMPRESSION: Interstitial prominence with bilateral perihilar and lower lobe opacities most compatible with edema/ CHF.   Electronically Signed   By: Charlett Nose M.D.   On: 09/20/2015 10:13   Dg Chest Portable 1 View  09/19/2015   CLINICAL DATA:  Shortness of breath for 1 day  EXAM: PORTABLE CHEST - 1 VIEW  COMPARISON:  03/09/2014  FINDINGS: Cardiac shadow is enlarged. Postsurgical changes are again seen. The lungs are well aerated bilaterally. Some new patchy infiltrate is noted in the right lung base. No bony abnormality is seen.  IMPRESSION: New right basilar infiltrate.   Electronically Signed   By: Alcide Clever M.D.   On: 09/19/2015 21:23     Assessment and Plan  74 y.o. male with h/o CAD s/p prior PCI of LAD and LCx in 2007 s/p CABG, ischemic cardiomyopathy/chronic systolic CHF, stroke with residual right sided paralysis, HTN, DM2, iron  deficiency anemia, hypothyroidism, and GERD who presented to [facility] on 9/25 with increased SOB and was found to have sepsis 2/2 PNA, acute on chronic systolic CHF, acute renal injury, and metabolic encephalopathy.  1. Acute respiratory failure with hypoxia and pulmonary edema: multifactorial including acute on chronic systolic CHF/ischemic cardiomyopathy and PNA with sepsis  Edema has improved,  --will decrease Lasix dosing down to 20 minute grams IV daily -Given drop in EF cardiac cath to be considered once his overall functional status improves   2. Acute on chronic systolic CHF/ischemic cardiomyopathy: -Unable to start beta blocker at this time given his acute respiratory failure, would look to start when able  -Unable to start ACEi/Entresto at this time given his renal function, would look to start when able  -  At later date would look to add in spironolactone if possible for cardiac remodeling   3. CAD s/p CABG: -Last ischemic evaluation 07/2013 as above -Plavix 75 mg daily  -Not on aspirin per history of prior stroke (per neurology note on 01/29/2013) -Look to start beta blocker and ACEi/Entresto when able as above --Consider cardiac catheterization at a later date once infection improves  4. PNA with sepsis: -On vancomycin and Zosyn per IM -Duonebs per IM  5. Acute renal injury: -Stable  -Monitor with diuresis  6. Metabolic encephalopathy: -Likely in the setting of numbers 1, 2, and 4   7. Leukocytosis: -Likely in the setting of #4 -Will need close monitoring   8. Severe hypokalemia: At least 40 mg twice a day, may need 3 times a day  9. New onset Afib: -Seen on telemetry  short run 2 days ago  No further atrial fibrillation on monitor in the past 24 hours -CHADSVASc at least 6 (CHF, HTN, age x 2, DM, vascular disease) giving him an estimated annual stroke risk of 9.8% --- Likely secondary to underlying pneumonia/sepsis We'll hold anticoagulation for now given 1  short run of arrhythmia, high fall risk    Signed, Dossie Arbour  [facility]  09/22/2015, 9:56 AM

## 2015-09-22 NOTE — Progress Notes (Signed)
Patient alert and oriented.  VSS.  No complaints of pain.  Urinary catheter draining.  Patient still being diuresed.  NSR-afib on monitor at time.  Getting IV abx.    Patient upset he cannot get in touch with his girlfriend.  Have tried multiple times, but no success.

## 2015-09-22 NOTE — Progress Notes (Addendum)
Pharmacy Consult for Electrolyte Management  Allergies  Allergen Reactions  . Ambien [Zolpidem Tartrate]     Patient Measurements: Height:  (185.4 cm) Weight: 182 lb 3.2 oz (82.645 kg) IBW/kg (Calculated) : 79.9   Vital Signs: Temp: 98.2 F (36.8 C) (09/28 1143) Temp Source: Oral (09/28 1143) BP: 127/74 mmHg (09/28 1143) Pulse Rate: 91 (09/28 1143) Intake/Output from previous day: 09/27 0701 - 09/28 0700 In: 830 [P.O.:480; IV Piggyback:350] Out: 4255 [Urine:4251; Stool:4] Intake/Output from this shift: Total I/O In: 240 [P.O.:240] Out: 1 [Stool:1]  Labs:  Recent Labs  09/19/15 2041 09/20/15 0539 09/21/15 0736 09/22/15 0420  WBC 15.7* 17.3* 15.4* 12.6*  HGB 10.5* 9.6* 8.9* 8.7*  HCT 33.8* 29.9* 27.8* 27.6*  PLT 272 165 166 177  APTT 29  --   --   --   INR 1.34  --   --   --      Recent Labs  09/19/15 2041 09/20/15 0539 09/20/15 1146  09/21/15 0736 09/21/15 1808 09/22/15 0420 09/22/15 1326  NA 142 144  --   --  146*  --  141  --   K 3.9 2.2*  --   < > 2.7* 3.0* 2.9* 2.8*  CL 105 111  --   --  108  --  98*  --   CO2 21* 25  --   --  28  --  31  --   GLUCOSE 212* 145*  --   --  128*  --  133*  --   BUN 16 18  --   --  19  --  18  --   CREATININE 1.46* 1.39*  --   --  1.43*  --  1.35*  --   CALCIUM 8.4* 8.0*  --   --  8.2*  --  8.4*  --   MG  --   --  1.7  --  1.7  --  1.8  --   PHOS  --   --  3.1  --  3.2  --   --   --   PROT 6.9  --   --   --   --   --   --   --   ALBUMIN 3.3*  --   --   --   --   --   --   --   AST 33  --   --   --   --   --   --   --   ALT 9*  --   --   --   --   --   --   --   ALKPHOS 78  --   --   --   --   --   --   --   BILITOT 1.2  --   --   --   --   --   --   --   < > = values in this interval not displayed. Estimated Creatinine Clearance: 53.4 mL/min (by C-G formula based on Cr of 1.35).   No results for input(s): GLUCAP in the last 72 hours.  Medical History: Past Medical History  Diagnosis Date  .  Cerebrovascular disease   . Hypothyroidism   . GERD (gastroesophageal reflux disease)   . Generalized weakness   . Iron deficiency anemia   . Dysphagia following cerebrovascular disease   . 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  Vitamin D deficiency   . Constipation   . CAD (coronary artery disease)     a. s/p CABG in 2010; b. nuc stress test 07/2013: no sig ischemia, large ant/apical/inf scar, EF 38%, normal study  . Ischemic cardiomyopathy     a. echo 01/2013: EF 25-35%, mile to mod AI  . Chronic systolic CHF (congestive heart failure)     a. echo 07/2013: 45-50%, mild biatrial enlargement, mild MR, mild aortic scl w/o stenosis, midl to mod AI, mild TR; b. echo 08/2015: EF 15-20%, mild AS, mild to mod MR, severe biatrial enlargement   . Stroke     a. residual right sided paralysis   . Smoker     former    Medications:  Scheduled:  . amoxicillin-clavulanate  1 tablet Oral Q12H  . atorvastatin  40 mg Oral Daily  . camphor-menthol  1 application Topical BID  . cholecalciferol  1,000 Units Oral Daily  . clopidogrel  75 mg Oral Daily  . enoxaparin (LOVENOX) injection  40 mg Subcutaneous Q24H  . furosemide  20 mg Intravenous Daily  . ipratropium-albuterol  3 mL Nebulization BID  . levothyroxine  100 mcg Oral QAC breakfast  . potassium chloride  40 mEq Oral BID  . sertraline  100 mg Oral Daily  . vitamin A & D  1 application Topical See admin instructions  . zinc oxide  1 application Topical See admin instructions   Infusions:   PRN: acetaminophen **OR** acetaminophen, docusate sodium, morphine injection, ondansetron **OR** ondansetron (ZOFRAN) IV, oxyCODONE, sodium chloride  Assessment: Pharmacy consulted to assist in managing electrolytes in this 74 y/o M with sepsis. Potassium remains low at 2.9 this AM.   MD ordered KCl 40 meq BID this AM.  Patient received 60 meq of KCl on 9/27.  Per MAR, patient refused KCl 40 meq  dose ordered by RPh on evening of 9/27. Of note, patient is receiving Lasix 20 mg IV q24h.    Patient received 40 meq of KCl at 0700  Mag level wnl.   K level at 1300 was 2.9  Plan: Will give of K IV now and change po K to 40 Meq TID. Pt will receive one dose of po K tonight at 2200. (total of today, 80 po and 40 IV). Will recheck K in AM   Melissa D Maccia, Pharm.D. Clinical Pharmacist 09/22/2015,3:31 PM

## 2015-09-22 NOTE — Progress Notes (Signed)
Pharmacy Consult for Electrolyte Management  Allergies  Allergen Reactions  . Ambien [Zolpidem Tartrate]     Patient Measurements: Height:  (185.4 cm) Weight: 182 lb 3.2 oz (82.645 kg) IBW/kg (Calculated) : 79.9   Vital Signs: Temp: 98.5 F (36.9 C) (09/28 0453) Temp Source: Oral (09/28 0453) BP: 113/63 mmHg (09/28 0453) Pulse Rate: 88 (09/28 0453) Intake/Output from previous day: 09/27 0701 - 09/28 0700 In: 830 [P.O.:480; IV Piggyback:350] Out: 3754 [Urine:3751; Stool:3] Intake/Output from this shift: Total I/O In: 50 [IV Piggyback:50] Out: 1400 [Urine:1400]  Labs:  Recent Labs  09/19/15 2041 09/20/15 0539 09/21/15 0736 09/22/15 0420  WBC 15.7* 17.3* 15.4* 12.6*  HGB 10.5* 9.6* 8.9* 8.7*  HCT 33.8* 29.9* 27.8* 27.6*  PLT 272 165 166 177  APTT 29  --   --   --   INR 1.34  --   --   --      Recent Labs  09/19/15 2041 09/20/15 0539 09/20/15 1146  09/21/15 0736 09/21/15 1808 09/22/15 0420  NA 142 144  --   --  146*  --  141  K 3.9 2.2*  --   < > 2.7* 3.0* 2.9*  CL 105 111  --   --  108  --  98*  CO2 21* 25  --   --  28  --  31  GLUCOSE 212* 145*  --   --  128*  --  133*  BUN 16 18  --   --  19  --  18  CREATININE 1.46* 1.39*  --   --  1.43*  --  1.35*  CALCIUM 8.4* 8.0*  --   --  8.2*  --  8.4*  MG  --   --  1.7  --  1.7  --  1.8  PHOS  --   --  3.1  --  3.2  --   --   PROT 6.9  --   --   --   --   --   --   ALBUMIN 3.3*  --   --   --   --   --   --   AST 33  --   --   --   --   --   --   ALT 9*  --   --   --   --   --   --   ALKPHOS 78  --   --   --   --   --   --   BILITOT 1.2  --   --   --   --   --   --   < > = values in this interval not displayed. Estimated Creatinine Clearance: 53.4 mL/min (by C-G formula based on Cr of 1.35).   No results for input(s): GLUCAP in the last 72 hours.  Medical History: Past Medical History  Diagnosis Date  . Cerebrovascular disease   . Hypothyroidism   . GERD (gastroesophageal reflux disease)   .  Generalized weakness   . Iron deficiency anemia   . Dysphagia following cerebrovascular disease   . 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 Vitamin D deficiency   . Constipation   . CAD (coronary artery disease)     a. s/p CABG in 2010; b. nuc stress test 07/2013: no sig ischemia, large ant/apical/inf scar, EF 38%, normal study  . Ischemic cardiomyopathy     a.  echo 01/2013: EF 25-35%, mile to mod AI  . Chronic systolic CHF (congestive heart failure)     a. echo 07/2013: 45-50%, mild biatrial enlargement, mild MR, mild aortic scl w/o stenosis, midl to mod AI, mild TR; b. echo 08/2015: EF 15-20%, mild AS, mild to mod MR, severe biatrial enlargement   . Stroke     a. residual right sided paralysis   . Smoker     former    Medications:  Scheduled:  . atorvastatin  40 mg Oral Daily  . camphor-menthol  1 application Topical BID  . cholecalciferol  1,000 Units Oral Daily  . clopidogrel  75 mg Oral Daily  . enoxaparin (LOVENOX) injection  40 mg Subcutaneous Q24H  . furosemide  40 mg Intravenous BID  . ipratropium-albuterol  3 mL Nebulization BID  . levothyroxine  100 mcg Oral QAC breakfast  . piperacillin-tazobactam (ZOSYN)  IV  3.375 g Intravenous 3 times per day  . potassium chloride  40 mEq Oral BID  . sertraline  100 mg Oral Daily  . vancomycin  1,250 mg Intravenous Q18H  . vitamin A & D  1 application Topical See admin instructions  . zinc oxide  1 application Topical See admin instructions   Infusions:   PRN: acetaminophen **OR** acetaminophen, docusate sodium, morphine injection, ondansetron **OR** ondansetron (ZOFRAN) IV, oxyCODONE, sodium chloride  Assessment: Pharmacy consulted to assist in managing electrolytes in this 74 y/o M with sepsis. Potassium remains low at 2.7 this AM.   MD ordered KCl 20 meq TID.  Patient received 40 meq of KCl at 0923.  Of note, patient is receiving Lasix 40 mg IV q12h.    Mag level wnl.    Plan:  Next dose of KCl 20 mEq scheduled for 1600.  Will recheck a follow up potassium at 1800 after this dose to assess need for higher doses of KCl. K level is 3.0 @ 1800. Pt received a total of 60 Meq today. Will give another tonight and recheck level in AM. Will discontinue the scheduled dose, in the AM will have a better idea of pt K needs  0928 AM K 2.9, Mg 1.8. Dr. Sheryle Hail ordered 40 mEq PO KCl BID, will follow up labs in AM.   Pharmacy will continue to follow.  Carola Frost, Pharm.D. Clinical Pharmacist 09/22/2015,6:12 AM

## 2015-09-22 NOTE — Progress Notes (Signed)
Patient potassium was modified from pill to powder form. Patientt refused his potassium and was educated/encouraged . Patient was repositioned and assisted as needed Q2 hours. Patient's potassium level of 2.9 was communicated to Dr. Sheryle Hail no new order received.

## 2015-09-22 NOTE — Progress Notes (Signed)
[facility] - [place] at [facility]   PATIENT NAME: Samuel Oneill    MR#:  [id number]  DATE OF BIRTH:  [birth year]  SUBJECTIVE:  CHIEF COMPLAINT:   Chief Complaint  Patient presents with  . Shortness of Breath  The patient is more alert and on xygen by nasal cannular 2 L.  REVIEW OF SYSTEMS:  CONSTITUTIONAL: No fever, generalized weakness.  EYES: No blurred or double vision.  EARS, NOSE, AND THROAT: No tinnitus or ear pain.  RESPIRATORY: No cough, shortness of breath, wheezing or hemoptysis.  CARDIOVASCULAR: No chest pain, orthopnea, edema.  GASTROINTESTINAL: has nausea, no vomiting, diarrhea or abdominal pain.  GENITOURINARY: No dysuria, hematuria.  ENDOCRINE: No polyuria, nocturia,  HEMATOLOGY: No anemia, easy bruising or bleeding SKIN: No rash or lesion. MUSCULOSKELETAL: No joint pain or arthritis.  NEUROLOGIC: No tingling, numbness, weakness.  PSYCHIATRY: has anxiety but no depression.   DRUG ALLERGIES:   Allergies  Allergen Reactions  . Ambien [Zolpidem Tartrate]     VITALS:  Blood pressure 127/74, pulse 91, temperature 98.2 F (36.8 C), temperature source Oral, resp. rate 18, height  (1.854 m), weight 82.645 kg (182 lb 3.2 oz), SpO2 97 %.  PHYSICAL EXAMINATION:  GENERAL:  74 y.o.-year-old patient lying in the bed, in no acute distress. EYES: Pupils equal, round, reactive to light and accommodation. No scleral icterus.  HEENT: Head atraumatic, normocephalic.  NECK:  Supple, no jugular venous distention. No thyroid enlargement, no tenderness.  LUNGS: Normal breath sounds bilaterally, no wheezing, mild bilateral crackles. No use of accessory muscles of respiration.  CARDIOVASCULAR: S1, S2 normal. No murmurs, rubs, or gallops.  ABDOMEN: Soft, nontender, nondistended. Bowel sounds present. No organomegaly or mass.  EXTREMITIES: No leg edema, no cyanosis, or clubbing.  NEUROLOGIC: Cranial nerves II through XII are intact.  Muscle strength 3/5 in all extremities. Sensation intact. Gait not checked.  PSYCHIATRIC: The patient is AAO 2. SKIN: No obvious rash, lesion, sacral DU stage 2.    LABORATORY PANEL:   CBC  Recent Labs Lab 09/22/15 0420  WBC 12.6*  HGB 8.7*  HCT 27.6*  PLT 177   ------------------------------------------------------------------------------------------------------------------  Chemistries   Recent Labs Lab 09/19/15 2041  09/22/15 0420  NA 142  < > 141  K 3.9  < > 2.9*  CL 105  < > 98*  CO2 21*  < > 31  GLUCOSE 212*  < > 133*  BUN 16  < > 18  CREATININE 1.46*  < > 1.35*  CALCIUM 8.4*  < > 8.4*  MG  --   < > 1.8  AST 33  --   --   ALT 9*  --   --   ALKPHOS 78  --   --   BILITOT 1.2  --   --   < > = values in this interval not displayed. ------------------------------------------------------------------------------------------------------------------  Cardiac Enzymes  Recent Labs Lab 09/20/15 1146  TROPONINI 0.08*   ------------------------------------------------------------------------------------------------------------------  RADIOLOGY:  Dg Chest 1 View  09/21/2015   CLINICAL DATA:  Cough, shortness of breath, and congestion.  EXAM: CHEST 1 VIEW  COMPARISON:  09/20/2015  FINDINGS: Sequelae of prior CABG are again identified. Cardiac silhouette remains moderately enlarged. Mild pulmonary vascular congestion is unchanged with mild interstitial prominence and asymmetric right perihilar and right basilar opacities, overall similar to the prior study. No pleural effusion or pneumothorax is identified.  IMPRESSION: Unchanged cardiomegaly, mild pulmonary vascular congestion, and right greater than left perihilar and bibasilar opacities. Findings  may reflect mild edema, although right lower lobe pneumonia is also possible.   Electronically Signed   By: Sebastian Ache M.D.   On: 09/21/2015 07:51    EKG:   Orders placed or performed during the hospital encounter of  09/19/15  . ED EKG  . ED EKG  . EKG 12-Lead  . EKG 12-Lead  . EKG 12-Lead  . EKG 12-Lead  . EKG 12-Lead  . EKG 12-Lead  . EKG 12-Lead  . EKG 12-Lead  . EKG 12-Lead  . EKG 12-Lead    ASSESSMENT AND PLAN:   1. Severe Sepsis and healthcare associated pneumonia, Continue vancomycin/Zosyn, blood culture and urine culture are negative so far.  leukocytosis is improving.  2. Acute respiratory failure with hypoxia:  Try to wean off oxygen, continue DuoNeb.  3. ARF. Improved. Treated with NS iv which was discontinued due toacute on chronic systolic CHF.  4. Acute on chronic systolic CHF (EF 96% -  20%),  decreased lasix to 20 mg daily, Given drop in EF cardiac cath to be considered once his overall functional status improves per Dr. Mariah Milling. Unable to start ACE inhibitor due to acute renal failure. At later date would look to add in spironolactone if possible for cardiac remodeling per Dr. Mariah Milling.  * new-onset A. Fib. No further atrial fibrillation on monitor in the past 24 hours. Per Dr. Mariah Milling, hold anticoagulation given 1 short run of arrhythmia, high fall risk.  5. Hypokalemia. improving, continue potassium supplement and follow-up BMP.  * elevated troponin, due to demand ischemia. Continue plavix and lipitor. * sacral DU stage 2.  Wound care. * CAD. continue Plavix. Consider cardiac catheterization at a later date once infection improves per Dr. Mariah Milling.   All the records are reviewed and case discussed with Care Management/Social Workerr. Management plans discussed with the patient' and he is in agreement.  CODE STATUS: Full Code  TOTAL TIME TAKING CARE OF THIS PATIENT: 75 minutes.   POSSIBLE D/C TO SKILLED NURSING FACILITY IN 2 DAYS, DEPENDING ON CLINICAL CONDITION.   Shaune Pollack M.D on 09/22/2015 at 2:26 PM  Between 7am to 6pm - Pager - [phone]  After 6pm go to [email or web address] - password [email or web address] [facility]  [place] [facility]  Office   [phone]  CC: Primary care physician; Bobbye Morton, MD

## 2015-09-23 ENCOUNTER — Encounter: Payer: Self-pay | Admitting: *Deleted

## 2015-09-23 LAB — POTASSIUM: Potassium: 4 mmol/L (ref 3.5–5.1)

## 2015-09-23 LAB — BASIC METABOLIC PANEL
Anion gap: 8 (ref 5–15)
BUN: 19 mg/dL (ref 6–20)
CALCIUM: 8.6 mg/dL — AB (ref 8.9–10.3)
CHLORIDE: 104 mmol/L (ref 101–111)
CO2: 31 mmol/L (ref 22–32)
CREATININE: 1.34 mg/dL — AB (ref 0.61–1.24)
GFR calc Af Amer: 58 mL/min — ABNORMAL LOW (ref >60)
GFR calc non Af Amer: 50 mL/min — ABNORMAL LOW (ref >60)
Glucose, Bld: 115 mg/dL — ABNORMAL HIGH (ref 65–99)
Potassium: 3.9 mmol/L (ref 3.5–5.1)
SODIUM: 143 mmol/L (ref 135–145)

## 2015-09-23 LAB — PHOSPHORUS: Phosphorus: 2.5 mg/dL (ref 2.5–4.6)

## 2015-09-23 LAB — MAGNESIUM
MAGNESIUM: 1.7 mg/dL (ref 1.7–2.4)
Magnesium: 1.9 mg/dL (ref 1.7–2.4)

## 2015-09-23 MED ORDER — ENSURE ENLIVE PO LIQD
237.0000 mL | Freq: Two times a day (BID) | ORAL | Status: DC
  Administered 2015-09-24: 237 mL via ORAL

## 2015-09-23 MED ORDER — POTASSIUM CHLORIDE CRYS ER 20 MEQ PO TBCR
40.0000 meq | EXTENDED_RELEASE_TABLET | Freq: Two times a day (BID) | ORAL | Status: DC
  Administered 2015-09-23 – 2015-09-24 (×2): 40 meq via ORAL
  Filled 2015-09-23 (×2): qty 2

## 2015-09-23 MED ORDER — CARVEDILOL 3.125 MG PO TABS
3.1250 mg | ORAL_TABLET | Freq: Two times a day (BID) | ORAL | Status: DC
  Administered 2015-09-23 – 2015-09-24 (×2): 3.125 mg via ORAL
  Filled 2015-09-23 (×2): qty 1

## 2015-09-23 MED ORDER — FUROSEMIDE 40 MG PO TABS
40.0000 mg | ORAL_TABLET | Freq: Two times a day (BID) | ORAL | Status: DC
  Administered 2015-09-23 – 2015-09-24 (×2): 40 mg via ORAL
  Filled 2015-09-23 (×2): qty 1

## 2015-09-23 NOTE — Progress Notes (Signed)
Nutrition Follow-up  DOCUMENTATION CODES:   Non-severe (moderate) malnutrition in context of chronic illness  INTERVENTION:   Medical Food Supplement Therapy: recommend addition of Ensure Enlive po BID, each supplement provides 350 kcal and 20 grams of protein Meals and Snacks: Cater to patient preferences  NUTRITION DIAGNOSIS:   Inadequate oral intake related to lethargy/confusion, acute illness as evidenced by NPO status. Improving as pt tolerating meal but eating <50% of meals on average, supplements added  GOAL:   Patient will meet greater than or equal to 90% of their needs  MONITOR:    (Energy Intake, Anthropometrics, Electrolyte/Renal Profile, Digestive System)  REASON FOR ASSESSMENT:   Malnutrition Screening Tool    ASSESSMENT:   Pt more alert on visit today, answering some questions  Diet Order:  Diet Heart Room service appropriate?: Yes; Fluid consistency:: Thin   Energy Intake: no intake recorded today, pt reports eating bites at lunch. Recorded po intake is 44% on average. Pt reports appetite is fair  Skin:   (stage II sacrum)  Nutrition Focused Physical Exam: Nutrition-Focused physical exam completed. Findings are mild fat depletion, mild to severe muscle depletion, and no edema.   Meds: lasix  Electrolyte and Renal Profile:  Recent Labs Lab 09/20/15 1146  09/21/15 0736  09/22/15 0420 09/22/15 1326 09/23/15 0544 09/23/15 1334  BUN  --   --  19  --  18  --  19  --   CREATININE  --   --  1.43*  --  1.35*  --  1.34*  --   NA  --   --  146*  --  141  --  143  --   K  --   < > 2.7*  < > 2.9* 2.8* 3.9 4.0  MG 1.7  --  1.7  --  1.8  --  1.7  --   PHOS 3.1  --  3.2  --   --   --  2.5  --   < > = values in this interval not displayed. Glucose Profile: No results for input(s): GLUCAP in the last 72 hours. Nutritional Anemia Profile:  CBC Latest Ref Rng 09/22/2015 09/21/2015 09/20/2015  WBC 3.8 - 10.6 K/uL 12.6(H) 15.4(H) 17.3(H)  Hemoglobin 13.0 - 18.0  g/dL 1.6(X) 8.9(L) 9.6(L)  Hematocrit 40.0 - 52.0 % 27.6(L) 27.8(L) 29.9(L)  Platelets 150 - 440 K/uL 177 166 165    Height:   Ht Readings from Last 1 Encounters:  09/19/15  (1.854 m)    Weight:   Wt Readings from Last 1 Encounters:  09/21/15 182 lb 3.2 oz (82.645 kg)   BMI:  Body mass index is 24.04 kg/(m^2).  Estimated Nutritional Needs:   Kcal:  2157-2352 kcals (BEE 1634, 1.2 AF, 1.1-1.2 IF)   Protein:  94-111 g (1.1-1.3 g/kg)   Fluid:  2125-2550 mL (25-30 ml/kg)   MODERATE Care Level  Romelle Starcher MS, RD, LDN [phone] Pager

## 2015-09-23 NOTE — Progress Notes (Signed)
Pharmacy Consult for Electrolyte Management  Allergies  Allergen Reactions  . Ambien [Zolpidem Tartrate]     Patient Measurements: Height:  (185.4 cm) Weight: 182 lb 3.2 oz (82.645 kg) IBW/kg (Calculated) : 79.9   Vital Signs: Temp: 98 F (36.7 C) (09/29 0416) Temp Source: Oral (09/29 0416) BP: 128/70 mmHg (09/29 0416) Pulse Rate: 93 (09/29 0416) Intake/Output from previous day: 09/28 0701 - 09/29 0700 In: 480 [P.O.:480] Out: 1752 [Urine:1750; Stool:2] Intake/Output from this shift: Total I/O In: 200 [Other:200] Out: -   Labs:  Recent Labs  09/21/15 0736 09/22/15 0420  WBC 15.4* 12.6*  HGB 8.9* 8.7*  HCT 27.8* 27.6*  PLT 166 177     Recent Labs  09/20/15 1146  09/21/15 0736  09/22/15 0420 09/22/15 1326 09/23/15 0544  NA  --   --  146*  --  141  --  143  K  --   < > 2.7*  < > 2.9* 2.8* 3.9  CL  --   --  108  --  98*  --  104  CO2  --   --  28  --  31  --  31  GLUCOSE  --   --  128*  --  133*  --  115*  BUN  --   --  19  --  18  --  19  CREATININE  --   --  1.43*  --  1.35*  --  1.34*  CALCIUM  --   --  8.2*  --  8.4*  --  8.6*  MG 1.7  --  1.7  --  1.8  --  1.7  PHOS 3.1  --  3.2  --   --   --  2.5  < > = values in this interval not displayed. Estimated Creatinine Clearance: 53.8 mL/min (by C-G formula based on Cr of 1.34).   No results for input(s): GLUCAP in the last 72 hours.  Medical History: Past Medical History  Diagnosis Date  . Cerebrovascular disease   . Hypothyroidism   . GERD (gastroesophageal reflux disease)   . Generalized weakness   . Iron deficiency anemia   . Dysphagia following cerebrovascular disease   . 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 Vitamin D deficiency   . Constipation   . CAD (coronary artery disease)     a. s/p CABG in 2010; b. nuc stress test 07/2013: no sig ischemia, large ant/apical/inf scar, EF 38%, normal study  . Ischemic cardiomyopathy    a. echo 01/2013: EF 25-35%, mile to mod AI  . Chronic systolic CHF (congestive heart failure)     a. echo 07/2013: 45-50%, mild biatrial enlargement, mild MR, mild aortic scl w/o stenosis, midl to mod AI, mild TR; b. echo 08/2015: EF 15-20%, mild AS, mild to mod MR, severe biatrial enlargement   . Stroke     a. residual right sided paralysis   . Smoker     former    Medications:  Scheduled:  . amoxicillin-clavulanate  1 tablet Oral Q12H  . atorvastatin  40 mg Oral Daily  . camphor-menthol  1 application Topical BID  . carvedilol  3.125 mg Oral BID WC  . cholecalciferol  1,000 Units Oral Daily  . clopidogrel  75 mg Oral Daily  . enoxaparin (LOVENOX) injection  40 mg Subcutaneous Q24H  . furosemide  40 mg Oral BID  . ipratropium-albuterol  3 mL Nebulization  BID  . levothyroxine  100 mcg Oral QAC breakfast  . potassium chloride  40 mEq Oral TID  . sertraline  100 mg Oral Daily  . vitamin A & D  1 application Topical See admin instructions  . zinc oxide  1 application Topical See admin instructions   Infusions:   PRN: acetaminophen **OR** acetaminophen, docusate sodium, morphine injection, ondansetron **OR** ondansetron (ZOFRAN) IV, oxyCODONE, sodium chloride  Assessment: Pharmacy consulted to assist in managing electrolytes in this 74 y/o M with sepsis. Potassium and magnesium  wnl today.  Patient currently ordered KCl 40 meq TID.  K: 3.9  Plan: Lasix transitioned to 40 mg po BID from 20 mg IV q24h.  Will order a follow up potassium level for 1300 to ensure appropriate dosing.    Pharmacy will continue to follow.   Scarpena,Crystal G, Pharm.D. Clinical Pharmacist 09/23/2015,11:26 AM

## 2015-09-23 NOTE — Progress Notes (Signed)
Patient: Samuel Oneill / Admit Date: 09/19/2015 / Date of Encounter: 09/23/2015, 10:02 AM   Subjective: Feeling better today. cough   Nonproductive Stable renal function,  Leg swelling improved He is more alert but continues to be not oriented.  Review of Systems: Review of Systems  Constitutional: Positive for malaise/fatigue. Negative for fever, chills and diaphoresis.  Respiratory: Positive for cough, shortness of breath and wheezing. Negative for hemoptysis and sputum production.   Cardiovascular: Negative for chest pain, palpitations, orthopnea, claudication, leg swelling and PND.  Gastrointestinal: Negative for nausea and vomiting.  Musculoskeletal: Negative for falls.  Skin: Negative for rash.  Neurological: Positive for weakness. Negative for sensory change, speech change, focal weakness and loss of consciousness.     Objective: Telemetry: Normal sinus rhythm Physical Exam: Blood pressure 128/70, pulse 93, temperature 98 F (36.7 C), temperature source Oral, resp. rate 19, height  (1.854 m), weight 182 lb 3.2 oz (82.645 kg), SpO2 99 %. Body mass index is 24.04 kg/(m^2). General: Well developed, well nourished, in no acute distress. Head: Normocephalic, atraumatic, sclera non-icteric, no xanthomas, nares are without discharge. Neck: Negative for carotid bruits. JVP not elevated. Lungs: Decreased breath sounds with crackles at the bilateral bases. Breathing is unlabored. Heart: RRR S1 S2. II/VI systolic murmurs. No rubs or gallops.  Abdomen: Soft, non-tender, non-distended with normoactive bowel sounds. No rebound/guarding. Extremities: No clubbing or cyanosis. No edema. Distal pedal pulses are 2+ and equal bilaterally. Neuro: Alert but not oriented. Moves all extremities spontaneously. Psych:  Responds to questions appropriately with a normal affect.   Intake/Output Summary (Last 24 hours) at 09/23/15 1002 Last data filed at 09/23/15 0553  Gross per 24 hour    Intake    240 ml  Output   1751 ml  Net  -1511 ml    Inpatient Medications:  . amoxicillin-clavulanate  1 tablet Oral Q12H  . atorvastatin  40 mg Oral Daily  . camphor-menthol  1 application Topical BID  . carvedilol  3.125 mg Oral BID WC  . cholecalciferol  1,000 Units Oral Daily  . clopidogrel  75 mg Oral Daily  . enoxaparin (LOVENOX) injection  40 mg Subcutaneous Q24H  . furosemide  40 mg Oral BID  . ipratropium-albuterol  3 mL Nebulization BID  . levothyroxine  100 mcg Oral QAC breakfast  . potassium chloride  40 mEq Oral TID  . sertraline  100 mg Oral Daily  . vitamin A & D  1 application Topical See admin instructions  . zinc oxide  1 application Topical See admin instructions   Infusions:    Labs:  Recent Labs  09/21/15 0736  09/22/15 0420 09/22/15 1326 09/23/15 0544  NA 146*  --  141  --  143  K 2.7*  < > 2.9* 2.8* 3.9  CL 108  --  98*  --  104  CO2 28  --  31  --  31  GLUCOSE 128*  --  133*  --  115*  BUN 19  --  18  --  19  CREATININE 1.43*  --  1.35*  --  1.34*  CALCIUM 8.2*  --  8.4*  --  8.6*  MG 1.7  --  1.8  --  1.7  PHOS 3.2  --   --   --  2.5  < > = values in this interval not displayed. No results for input(s): AST, ALT, ALKPHOS, BILITOT, PROT, ALBUMIN in the last 72 hours.  Recent Labs  09/21/15 0736 09/22/15 0420  WBC 15.4* 12.6*  HGB 8.9* 8.7*  HCT 27.8* 27.6*  MCV 92.2 90.6  PLT 166 177    Recent Labs  09/20/15 1146  TROPONINI 0.08*   Invalid input(s): POCBNP No results for input(s): HGBA1C in the last 72 hours.   Weights: Filed Weights   09/19/15 2114 09/19/15 2208 09/21/15 0608  Weight: 191 lb 6.4 oz (86.818 kg) 188 lb 0.8 oz (85.3 kg) 182 lb 3.2 oz (82.645 kg)     Radiology/Studies:  Dg Chest 1 View  09/21/2015   CLINICAL DATA:  Cough, shortness of breath, and congestion.  EXAM: CHEST 1 VIEW  COMPARISON:  09/20/2015  FINDINGS: Sequelae of prior CABG are again identified. Cardiac silhouette remains moderately enlarged.  Mild pulmonary vascular congestion is unchanged with mild interstitial prominence and asymmetric right perihilar and right basilar opacities, overall similar to the prior study. No pleural effusion or pneumothorax is identified.  IMPRESSION: Unchanged cardiomegaly, mild pulmonary vascular congestion, and right greater than left perihilar and bibasilar opacities. Findings may reflect mild edema, although right lower lobe pneumonia is also possible.   Electronically Signed   By: Sebastian Ache M.D.   On: 09/21/2015 07:51   Dg Chest 1 View  09/20/2015   CLINICAL DATA:  Acute respiratory failure. Pulmonary edema. Shortness of breath.  EXAM: CHEST 1 VIEW  COMPARISON:  09/19/2015  FINDINGS: Prior CABG. Cardiomegaly with vascular congestion and mild interstitial prominence, likely interstitial edema. Bilateral lower lobe opacities also likely reflect edema. No visible effusions or acute bony abnormality.  IMPRESSION: Interstitial prominence with bilateral perihilar and lower lobe opacities most compatible with edema/ CHF.   Electronically Signed   By: Charlett Nose M.D.   On: 09/20/2015 10:13   Dg Chest Portable 1 View  09/19/2015   CLINICAL DATA:  Shortness of breath for 1 day  EXAM: PORTABLE CHEST - 1 VIEW  COMPARISON:  03/09/2014  FINDINGS: Cardiac shadow is enlarged. Postsurgical changes are again seen. The lungs are well aerated bilaterally. Some new patchy infiltrate is noted in the right lung base. No bony abnormality is seen.  IMPRESSION: New right basilar infiltrate.   Electronically Signed   By: Alcide Clever M.D.   On: 09/19/2015 21:23     Assessment and Plan  74 y.o. male with h/o CAD s/p prior PCI of LAD and LCx in 2007 s/p CABG, ischemic cardiomyopathy/chronic systolic CHF, stroke with residual right sided paralysis, HTN, DM2, iron deficiency anemia, hypothyroidism, and GERD who presented to [facility] on 9/25 with increased SOB and was found to have sepsis 2/2 PNA, acute on chronic systolic CHF, acute renal  injury, and metabolic encephalopathy.  1. Acute respiratory failure with hypoxia and pulmonary edema: multifactorial including acute on chronic systolic CHF/ischemic cardiomyopathy and PNA with sepsis  Edema has improved,  --I switched furosemide to 40 mg by mouth twice daily.   2. Acute on chronic systolic CHF/ischemic cardiomyopathy: -I started small dose carvedilol 3.125 mg twice daily. -Unable to start ACEi/Entresto at this time given his renal function, would look to start when able  -At later date would look to add in spironolactone if possible for cardiac remodeling   3. CAD s/p CABG: -Last ischemic evaluation 07/2013 as above -Plavix 75 mg daily  -- Although the patient is more alert, he continues to be not oriented. I wonder if he has underlying dementia. Given this and multiple other comorbidities, it might be best to treat him medically and avoid invasive workup.  4. PNA with sepsis: -On vancomycin and Zosyn per IM -Duonebs per IM  5. Acute renal injury: -Stable  -Monitor with diuresis  6. Metabolic encephalopathy: -Likely in the setting of numbers 1, 2, and 4     Signed, Lorine Bears, MD   [facility]  09/23/2015, 10:02 AM

## 2015-09-23 NOTE — Plan of Care (Signed)
Problem: Phase I Progression Outcomes Goal: Tolerating diet Outcome: Progressing Pt is alert and oriented with confusion at times, bed bound, foley catheter removed, poor appetite, on po lasix, bm throughout shift, potassium level normalized, kidney function stable, vss, pt is likely to d/c on 9/30, pt anxious about not speaking with gf, nursing called gf and patient felt calmed after speaking with her. Uneventful shift.

## 2015-09-23 NOTE — Progress Notes (Signed)
Pharmacy Consult for Electrolyte Management  Allergies  Allergen Reactions  . Ambien [Zolpidem Tartrate]     Patient Measurements: Height:  (185.4 cm) Weight: 182 lb 3.2 oz (82.645 kg) IBW/kg (Calculated) : 79.9   Vital Signs: Temp: 98.8 F (37.1 C) (09/29 1132) Temp Source: Axillary (09/29 1132) BP: 129/65 mmHg (09/29 1132) Pulse Rate: 79 (09/29 1132) Intake/Output from previous day: 09/28 0701 - 09/29 0700 In: 480 [P.O.:480] Out: 1752 [Urine:1750; Stool:2] Intake/Output from this shift: Total I/O In: 200 [Other:200] Out: -   Labs:  Recent Labs  09/21/15 0736 09/22/15 0420  WBC 15.4* 12.6*  HGB 8.9* 8.7*  HCT 27.8* 27.6*  PLT 166 177     Recent Labs  09/21/15 0736  09/22/15 0420 09/22/15 1326 09/23/15 0544 09/23/15 1334  NA 146*  --  141  --  143  --   K 2.7*  < > 2.9* 2.8* 3.9 4.0  CL 108  --  98*  --  104  --   CO2 28  --  31  --  31  --   GLUCOSE 128*  --  133*  --  115*  --   BUN 19  --  18  --  19  --   CREATININE 1.43*  --  1.35*  --  1.34*  --   CALCIUM 8.2*  --  8.4*  --  8.6*  --   MG 1.7  --  1.8  --  1.7  --   PHOS 3.2  --   --   --  2.5  --   < > = values in this interval not displayed. Estimated Creatinine Clearance: 53.8 mL/min (by C-G formula based on Cr of 1.34).   No results for input(s): GLUCAP in the last 72 hours.  Medical History: Past Medical History  Diagnosis Date  . Cerebrovascular disease   . Hypothyroidism   . GERD (gastroesophageal reflux disease)   . Generalized weakness   . Iron deficiency anemia   . Dysphagia following cerebrovascular disease   . 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 Vitamin D deficiency   . Constipation   . CAD (coronary artery disease)     a. s/p CABG in 2010; b. nuc stress test 07/2013: no sig ischemia, large ant/apical/inf scar, EF 38%, normal study  . Ischemic cardiomyopathy     a. echo 01/2013: EF 25-35%, mile to mod AI   . Chronic systolic CHF (congestive heart failure)     a. echo 07/2013: 45-50%, mild biatrial enlargement, mild MR, mild aortic scl w/o stenosis, midl to mod AI, mild TR; b. echo 08/2015: EF 15-20%, mild AS, mild to mod MR, severe biatrial enlargement   . Stroke     a. residual right sided paralysis   . Smoker     former    Medications:  Scheduled:  . amoxicillin-clavulanate  1 tablet Oral Q12H  . atorvastatin  40 mg Oral Daily  . camphor-menthol  1 application Topical BID  . carvedilol  3.125 mg Oral BID WC  . cholecalciferol  1,000 Units Oral Daily  . clopidogrel  75 mg Oral Daily  . enoxaparin (LOVENOX) injection  40 mg Subcutaneous Q24H  . furosemide  40 mg Oral BID  . ipratropium-albuterol  3 mL Nebulization BID  . levothyroxine  100 mcg Oral QAC breakfast  . potassium chloride  40 mEq Oral BID  . sertraline  100 mg Oral Daily  .  vitamin A & D  1 application Topical See admin instructions  . zinc oxide  1 application Topical See admin instructions   Infusions:   PRN: acetaminophen **OR** acetaminophen, docusate sodium, morphine injection, ondansetron **OR** ondansetron (ZOFRAN) IV, oxyCODONE, sodium chloride  Assessment: Pharmacy consulted to assist in managing electrolytes in this 74 y/o M with sepsis. Potassium and magnesium  wnl today.  Patient currently ordered KCl 40 meq TID.  K: 3.9 Follow Up K at 1300 of 4.0  Plan: Lasix transitioned to 40 mg po BID from 20 mg IV q24h.  Follow up potassium of 4.0.  Will transition patient to Potassium 40 meq po BID and recheck BMP in AM.   Pharmacy will continue to follow.   Scarpena,Crystal G, Pharm.D. Clinical Pharmacist 09/23/2015,2:41 PM

## 2015-09-23 NOTE — Progress Notes (Signed)
[facility] - [place] at [facility]   PATIENT NAME: Samuel Oneill    MR#:  [id number]  DATE OF BIRTH:  Apr 12, 1941  SUBJECTIVE:  CHIEF COMPLAINT:   Chief Complaint  Patient presents with  . Shortness of Breath  The patient is more alert and on xygen by nasal cannular 2 L.  REVIEW OF SYSTEMS:  CONSTITUTIONAL: No fever, generalized weakness. Good appetite. EYES: No blurred or double vision.  EARS, NOSE, AND THROAT: No tinnitus or ear pain.  RESPIRATORY: No cough, shortness of breath, wheezing or hemoptysis.  CARDIOVASCULAR: No chest pain, orthopnea, edema.  GASTROINTESTINAL: has nausea, no vomiting, diarrhea or abdominal pain.  GENITOURINARY: No dysuria, hematuria.  ENDOCRINE: No polyuria, nocturia,  HEMATOLOGY: No anemia, easy bruising or bleeding SKIN: No rash or lesion. MUSCULOSKELETAL: No joint pain or arthritis.  NEUROLOGIC: No tingling, numbness, weakness.  PSYCHIATRY: has anxiety but no depression.   DRUG ALLERGIES:   Allergies  Allergen Reactions  . Ambien [Zolpidem Tartrate]     VITALS:  Blood pressure 129/65, pulse 79, temperature 98.8 F (37.1 C), temperature source Axillary, resp. rate 18, height  (1.854 m), weight 82.645 kg (182 lb 3.2 oz), SpO2 96 %.  PHYSICAL EXAMINATION:  GENERAL:  74 y.o.-year-old patient lying in the bed, in no acute distress. EYES: Pupils equal, round, reactive to light and accommodation. No scleral icterus.  HEENT: Head atraumatic, normocephalic.  NECK:  Supple, no jugular venous distention. No thyroid enlargement, no tenderness.  LUNGS: Normal breath sounds bilaterally, no wheezing, mild bilateral crackles. No use of accessory muscles of respiration.  CARDIOVASCULAR: S1, S2 normal. No murmurs, rubs, or gallops.  ABDOMEN: Soft, nontender, nondistended. Bowel sounds present. No organomegaly or mass.  EXTREMITIES: No leg edema, no cyanosis, or clubbing.  NEUROLOGIC: Cranial nerves II through  XII are intact. Muscle strength 3/5 in all extremities. Sensation intact. Gait not checked.  PSYCHIATRIC: The patient is AAO 2. SKIN: No obvious rash, lesion, sacral DU stage 2.    LABORATORY PANEL:   CBC  Recent Labs Lab 09/22/15 0420  WBC 12.6*  HGB 8.7*  HCT 27.6*  PLT 177   ------------------------------------------------------------------------------------------------------------------  Chemistries   Recent Labs Lab 09/19/15 2041  09/23/15 0544 09/23/15 1334  NA 142  < > 143  --   K 3.9  < > 3.9 4.0  CL 105  < > 104  --   CO2 21*  < > 31  --   GLUCOSE 212*  < > 115*  --   BUN 16  < > 19  --   CREATININE 1.46*  < > 1.34*  --   CALCIUM 8.4*  < > 8.6*  --   MG  --   < > 1.7  --   AST 33  --   --   --   ALT 9*  --   --   --   ALKPHOS 78  --   --   --   BILITOT 1.2  --   --   --   < > = values in this interval not displayed. ------------------------------------------------------------------------------------------------------------------  Cardiac Enzymes  Recent Labs Lab 09/20/15 1146  TROPONINI 0.08*   ------------------------------------------------------------------------------------------------------------------  RADIOLOGY:  No results found.  EKG:   Orders placed or performed during the hospital encounter of 09/19/15  . ED EKG  . ED EKG  . EKG 12-Lead  . EKG 12-Lead  . EKG 12-Lead  . EKG 12-Lead  . EKG 12-Lead  . EKG 12-Lead  .  EKG 12-Lead  . EKG 12-Lead  . EKG 12-Lead  . EKG 12-Lead    ASSESSMENT AND PLAN:   1. Severe Sepsis and healthcare associated pneumonia, Discontinued vancomycin/Zosyn, blood culture and urine culture are negative so far.  leukocytosis is improving. Started Augmentin by mouth twice a day.  2. Acute respiratory failure with hypoxia:  Try to wean off oxygen, continue DuoNeb.  3. ARF. Improved. Treated with NS iv which was discontinued due to acute on chronic systolic CHF.  4. Acute on chronic systolic CHF (EF  15% -  20%),  increase lasix to 40 mg twice a day, start Coreg 3.125 mg twice a day per Dr. Kirke Corin.  Given this and multiple other comorbidities, it might be best to treat him medically and avoid invasive workup per Dr. Kirke Corin. At later date would look to add in spironolactone if possible for cardiac remodeling per Dr. Mariah Milling.   * new-onset A. Fib. No further atrial fibrillation on monitor in the past 24 hours. Per Dr. Mariah Milling, hold anticoagulation given 1 short run of arrhythmia, high fall risk.  5. Hypokalemia. Improved on potassium supplement.  * elevated troponin, due to demand ischemia. CAD.  Continue plavix and lipitor. Given this and multiple other comorbidities, it might be best to treat him medically and avoid invasive workup per Dr. Kirke Corin.  * sacral DU stage 2.  Wound care.  All the records are reviewed and case discussed with Care Management/Social Workerr. Management plans discussed with the patient' and he is in agreement.  CODE STATUS: Full Code  TOTAL TIME TAKING CARE OF THIS PATIENT: 35 minutes.   POSSIBLE D/C TO SKILLED NURSING FACILITY IN 2 DAYS, DEPENDING ON CLINICAL CONDITION.   Shaune Pollack M.D on 09/23/2015 at 2:09 PM  Between 7am to 6pm - Pager - [phone]  After 6pm go to [email or web address] - password [email or web address] [facility]  [place] [facility]  Office  [phone]  CC: Primary care physician; Bobbye Morton, MD

## 2015-09-23 NOTE — Care Management Important Message (Signed)
Important Message  Patient Details  Name: Samuel Oneill MRN: [id number] Date of Birth: Sep 25, 1941   Medicare Important Message Given:  Yes-third notification given    Olegario Messier A Allmond 09/23/2015, 9:16 AM

## 2015-09-24 ENCOUNTER — Telehealth: Payer: Self-pay

## 2015-09-24 LAB — BASIC METABOLIC PANEL
Anion gap: 9 (ref 5–15)
BUN: 18 mg/dL (ref 6–20)
CALCIUM: 8.5 mg/dL — AB (ref 8.9–10.3)
CO2: 30 mmol/L (ref 22–32)
CREATININE: 1.3 mg/dL — AB (ref 0.61–1.24)
Chloride: 103 mmol/L (ref 101–111)
GFR calc Af Amer: >60 mL/min (ref >60)
GFR calc non Af Amer: 52 mL/min — ABNORMAL LOW (ref >60)
GLUCOSE: 117 mg/dL — AB (ref 65–99)
Potassium: 3.5 mmol/L (ref 3.5–5.1)
Sodium: 142 mmol/L (ref 135–145)

## 2015-09-24 LAB — CBC
HEMATOCRIT: 29.9 % — AB (ref 40.0–52.0)
Hemoglobin: 9.4 g/dL — ABNORMAL LOW (ref 13.0–18.0)
MCH: 28.6 pg (ref 26.0–34.0)
MCHC: 31.6 g/dL — AB (ref 32.0–36.0)
MCV: 90.7 fL (ref 80.0–100.0)
Platelets: 189 10*3/uL (ref 150–440)
RBC: 3.29 MIL/uL — ABNORMAL LOW (ref 4.40–5.90)
RDW: 17.3 % — AB (ref 11.5–14.5)
WBC: 8.6 10*3/uL (ref 3.8–10.6)

## 2015-09-24 LAB — CULTURE, BLOOD (ROUTINE X 2): CULTURE: NO GROWTH

## 2015-09-24 MED ORDER — CARVEDILOL 3.125 MG PO TABS: 3.1250 mg | ORAL_TABLET | Freq: Two times a day (BID) | ORAL | Status: AC

## 2015-09-24 MED ORDER — FUROSEMIDE 40 MG PO TABS: 20.0000 mg | ORAL_TABLET | Freq: Two times a day (BID) | ORAL | Status: DC

## 2015-09-24 MED ORDER — POTASSIUM CHLORIDE CRYS ER 20 MEQ PO TBCR: 20.0000 meq | EXTENDED_RELEASE_TABLET | Freq: Two times a day (BID) | ORAL | Status: DC

## 2015-09-24 MED ORDER — AMOXICILLIN-POT CLAVULANATE 875-125 MG PO TABS: 1.0000 | ORAL_TABLET | Freq: Two times a day (BID) | ORAL | Status: AC

## 2015-09-24 MED ORDER — FUROSEMIDE 40 MG PO TABS: 40.0000 mg | ORAL_TABLET | Freq: Two times a day (BID) | ORAL | Status: AC

## 2015-09-24 MED ORDER — POTASSIUM CHLORIDE CRYS ER 20 MEQ PO TBCR: 40.0000 meq | EXTENDED_RELEASE_TABLET | Freq: Two times a day (BID) | ORAL | Status: AC

## 2015-09-24 NOTE — Discharge Summary (Signed)
[facility] - [place] at [facility]   PATIENT NAME: Samuel Oneill    MR#:  [id number]  DATE OF BIRTH:  [birth year]  DATE OF ADMISSION:  09/19/2015 ADMITTING PHYSICIAN: Wyatt Haste, MD  DATE OF DISCHARGE: 09/24/2015 PRIMARY CARE PHYSICIAN: Bobbye Morton, MD    ADMISSION DIAGNOSIS:  Healthcare-associated pneumonia [J18.9] Acute respiratory failure with hypoxia [J96.01]   DISCHARGE DIAGNOSIS:  1. Severe Sepsis and healthcare associated pneumonia 2. Acute respiratory failure with hypoxia 3. ARF 4. Acute on chronic systolic CHF (EF 78% -  20%) 5. new-onset A. Fib.  SECONDARY DIAGNOSIS:   Past Medical History  Diagnosis Date  . Cerebrovascular disease   . Hypothyroidism   . GERD (gastroesophageal reflux disease)   . Generalized weakness   . Iron deficiency anemia   . Dysphagia following cerebrovascular disease   . 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 Vitamin D deficiency   . Constipation   . CAD (coronary artery disease)     a. s/p CABG in 2010; b. nuc stress test 07/2013: no sig ischemia, large ant/apical/inf scar, EF 38%, normal study  . Ischemic cardiomyopathy     a. echo 01/2013: EF 25-35%, mile to mod AI  . Chronic systolic CHF (congestive heart failure)     a. echo 07/2013: 45-50%, mild biatrial enlargement, mild MR, mild aortic scl w/o stenosis, midl to mod AI, mild TR; b. echo 08/2015: EF 15-20%, mild AS, mild to mod MR, severe biatrial enlargement   . Stroke     a. residual right sided paralysis   . Smoker     former    HOSPITAL COURSE:   1. Severe Sepsis and healthcare associated pneumonia, The patient was admitted to ICU and put on BiPAP. He was treated with vancomycin/Zosyn, blood culture and urine culture are negative so far. Leukocytosis is improved. Vancomycin and Zosyn were discontinued and Augmentin by mouth twice a day was started yesterday.  2. Acute respiratory  failure with hypoxia:  He was off BiPAP, on oxygen by nasal cannula and DuoNeb.  3. ARF. Improved after treatemet with NS iv which was discontinued due to acute on chronic systolic CHF.  4. Acute on chronic systolic CHF (EF 29% -  20%), the patient has been treated with lasix and started Coreg 3.125 mg twice a day per Dr. Kirke Corin.  Given this and multiple other comorbidities, it might be best to treat him medically and avoid invasive workup per Dr. Kirke Corin. At later date would look to add in spironolactone if possible for cardiac remodeling per Dr. Mariah Milling. Continue Lasix 40 mg  twice a day and potassium 40  meq twice a day per cardiology PA.   * new-onset A. Fib. The patient was found to have a new onset A. Fib. He was treated with Coreg. Per Dr. Mariah Milling, hold anticoagulation given 1 short run of arrhythmia, high fall risk.  5. Hypokalemia. The patient has very low potassium level and Improved on potassium supplement. Continue potassium supplement after discharge.  * elevated troponin, due to demand ischemia. CAD. He has been treated with plavix and lipitor. Given this and multiple other comorbidities, it might be best to treat him medically and avoid invasive workup per Dr. Kirke Corin.  * sacral DU stage 2. Wound care.  DISCHARGE CONDITIONS:   Stable, discharge back to skilled nursing facility today.  CONSULTS OBTAINED:  Treatment Team:  Wyatt Haste, MD Shane Crutch, MD [facility]  Argentina Donovan, MD  DRUG ALLERGIES:   Allergies  Allergen Reactions  . Ambien [Zolpidem Tartrate]     DISCHARGE MEDICATIONS:   Current Discharge Medication List    START taking these medications   Details  amoxicillin-clavulanate (AUGMENTIN) 875-125 MG tablet Take 1 tablet by mouth every 12 (twelve) hours. Qty: 10 tablet, Refills: 0    carvedilol (COREG) 3.125 MG tablet Take 1 tablet (3.125 mg total) by mouth 2 (two) times daily with a meal. Qty: 30 tablet, Refills: 0    furosemide (LASIX) 40 MG  tablet Take 1 tablet (40 mg total) by mouth 2 (two) times daily. Qty: 60 tablet, Refills: 0    potassium chloride SA (K-DUR,KLOR-CON) 20 MEQ tablet Take 2 tablets (40 mEq total) by mouth 2 (two) times daily. Qty: 40 tablet, Refills: 0      CONTINUE these medications which have NOT CHANGED   Details  atorvastatin (LIPITOR) 40 MG tablet Take 40 mg by mouth daily.    camphor-menthol (SARNA) lotion Apply 1 application topically 2 (two) times daily.    clopidogrel (PLAVIX) 75 MG tablet Take 75 mg by mouth daily.    docusate sodium (COLACE) 100 MG capsule Take 200 mg by mouth daily as needed for mild constipation.    levothyroxine (SYNTHROID, LEVOTHROID) 100 MCG tablet Take 100 mcg by mouth daily before breakfast.    sertraline (ZOLOFT) 100 MG tablet Take 100 mg by mouth daily.    Vitamin D, Cholecalciferol, 1000 UNITS CAPS Take 1,000 Units by mouth daily.    Vitamins A & D (VITAMIN A & D) ointment Apply 1 application topically See admin instructions. Apply to sacrum and buttocks daily and as needed after toileting    Zinc Oxide (DESITIN) 13 % CREA Apply 1 application topically See admin instructions. Apply to buttocks twice daily and twice daily as needed for moisture barrier         DISCHARGE INSTRUCTIONS:    If you experience worsening of your admission symptoms, develop shortness of breath, life threatening emergency, suicidal or homicidal thoughts you must seek medical attention immediately by calling 911 or calling your MD immediately  if symptoms less severe.  You Must read complete instructions/literature along with all the possible adverse reactions/side effects for all the Medicines you take and that have been prescribed to you. Take any new Medicines after you have completely understood and accept all the possible adverse reactions/side effects.   Please note  You were cared for by a hospitalist during your hospital stay. If you have any questions about your discharge  medications or the care you received while you were in the hospital after you are discharged, you can call the unit and asked to speak with the hospitalist on call if the hospitalist that took care of you is not available. Once you are discharged, your primary care physician will handle any further medical issues. Please note that NO REFILLS for any discharge medications will be authorized once you are discharged, as it is imperative that you return to your primary care physician (or establish a relationship with a primary care physician if you do not have one) for your aftercare needs so that they can reassess your need for medications and monitor your lab values.    Today   SUBJECTIVE   No complaint.   VITAL SIGNS:  Blood pressure 118/63, pulse 87, temperature 98.3 F (36.8 C), temperature source Oral, resp. rate 18, height  (1.854 m), weight 82.645 kg (182 lb 3.2 oz),  SpO2 98 %.  I/O:   Intake/Output Summary (Last 24 hours) at 09/24/15 1003 Last data filed at 09/23/15 1630  Gross per 24 hour  Intake    200 ml  Output      0 ml  Net    200 ml    PHYSICAL EXAMINATION:  GENERAL:  74 y.o.-year-old patient lying in the bed with no acute distress.  EYES: Pupils equal, round, reactive to light and accommodation. No scleral icterus. Extraocular muscles intact.  HEENT: Head atraumatic, normocephalic. Oropharynx and nasopharynx clear.  NECK:  Supple, no jugular venous distention. No thyroid enlargement, no tenderness.  LUNGS: Normal breath sounds bilaterally, no wheezing, mild rales. No use of accessory muscles of respiration.  CARDIOVASCULAR: S1, S2 normal. No murmurs, rubs, or gallops.  ABDOMEN: Soft, non-tender, non-distended. Bowel sounds present. No organomegaly or mass.  EXTREMITIES: No pedal edema, cyanosis, or clubbing.  NEUROLOGIC: Cranial nerves II through XII are intact. Muscle strength 2/5 in lower extremities. Sensation intact. Gait not checked.  PSYCHIATRIC: The patient  is alert and oriented x 2-3.  SKIN: No obvious rash, lesion, or ulcer.   DATA REVIEW:   CBC  Recent Labs Lab 09/24/15 0429  WBC 8.6  HGB 9.4*  HCT 29.9*  PLT 189    Chemistries   Recent Labs Lab 09/19/15 2041  09/23/15 1334 09/24/15 0429  NA 142  < >  --  142  K 3.9  < > 4.0 3.5  CL 105  < >  --  103  CO2 21*  < >  --  30  GLUCOSE 212*  < >  --  117*  BUN 16  < >  --  18  CREATININE 1.46*  < >  --  1.30*  CALCIUM 8.4*  < >  --  8.5*  MG  --   < > 1.9  --   AST 33  --   --   --   ALT 9*  --   --   --   ALKPHOS 78  --   --   --   BILITOT 1.2  --   --   --   < > = values in this interval not displayed.  Cardiac Enzymes  Recent Labs Lab 09/20/15 1146  TROPONINI 0.08*    Microbiology Results  Results for orders placed or performed during the hospital encounter of 09/19/15  Blood culture (routine x 2)     Status: None (Preliminary result)   Collection Time: 09/19/15  8:35 PM  Result Value Ref Range Status   Specimen Description BLOOD LEFT ASSIST CONTROL  Final   Special Requests BOTTLES DRAWN AEROBIC AND ANAEROBIC 4CC  Final   Culture NO GROWTH 4 DAYS  Final   Report Status PENDING  Incomplete  Blood culture (routine x 2)     Status: None   Collection Time: 09/19/15  8:45 PM  Result Value Ref Range Status   Specimen Description BLOOD LEFT WRIST  Final   Special Requests BOTTLES DRAWN AEROBIC AND ANAEROBIC 3CC  Final   Culture NO GROWTH 5 DAYS  Final   Report Status 09/24/2015 FINAL  Final  Urine culture     Status: None   Collection Time: 09/19/15  9:01 PM  Result Value Ref Range Status   Specimen Description URINE, RANDOM  Final   Special Requests NONE  Final   Culture NO GROWTH 1 DAY  Final   Report Status 09/21/2015 FINAL  Final  MRSA PCR Screening  Status: None   Collection Time: 09/19/15 10:42 PM  Result Value Ref Range Status   MRSA by PCR NEGATIVE NEGATIVE Final    Comment:        The GeneXpert MRSA Assay (FDA approved for NASAL  specimens only), is one component of a comprehensive MRSA colonization surveillance program. It is not intended to diagnose MRSA infection nor to guide or monitor treatment for MRSA infections.     RADIOLOGY:  No results found.      Management plans discussed with the patient, family and they are in agreement.  CODE STATUS:     Code Status Orders        Start     Ordered   09/19/15 2153  Do not attempt resuscitation (DNR)   Continuous    Question Answer Comment  In the event of cardiac or respiratory ARREST Do not call a "code blue"   In the event of cardiac or respiratory ARREST Do not perform Intubation, CPR, defibrillation or ACLS   In the event of cardiac or respiratory ARREST Use medication by any route, position, wound care, and other measures to relive pain and suffering. Barnard Sharps use oxygen, suction and manual treatment of airway obstruction as needed for comfort.      09/19/15 2153    Advance Directive Documentation        Most Recent Value   Type of Advance Directive  Out of facility DNR (pink MOST or yellow form)   Pre-existing out of facility DNR order (yellow form or pink MOST form)     "MOST" Form in Place?        TOTAL TIME TAKING CARE OF THIS PATIENT: 38  minutes.    Shaune Pollack M.D on 09/24/2015 at 10:03 AM  Between 7am to 6pm - Pager - [phone]  After 6pm go to [email or web address] - password [email or web address] [facility]  [facility]  Office  [phone]  CC: Primary care physician; Bobbye Morton, MD

## 2015-09-24 NOTE — Clinical Social Work Note (Signed)
CSW notified pt, facility, [facility], and RN that pt would DC today via [facility] transport to [facility]. DC summary sent to facility and in DC packet  CSW signing off

## 2015-09-24 NOTE — Discharge Instructions (Signed)
Low sodium and heart healthy diet. Activity as tolerated. Fall precaution.

## 2015-09-24 NOTE — Telephone Encounter (Signed)
S/w Misty Stanley at [facility] center as this is the number on file w/pt. Per Misty Stanley, I need to speak with Loma Sousa. Left VM for Edna to [facility] regarding pt appt.

## 2015-09-24 NOTE — Clinical Social Work Note (Addendum)
DC summary needed in order to DC pt to [facility].  Facility will not accept pt without these documents.

## 2015-09-24 NOTE — Progress Notes (Signed)
Report called to [facility] at [facility]. 2 L of oxygen. NSR. Takes meds crushed in apple sauce. No skin issues. Last BM was 9/29. No pain. IV and tele removed. EF 15-20%. Confused. Pace to take to [facility]. Pt has no further concerns at this time.

## 2015-09-25 LAB — CULTURE, BLOOD (ROUTINE X 2): CULTURE: NO GROWTH

## 2015-09-28 NOTE — Telephone Encounter (Signed)
Left message for Loma Sousa to [facility] regarding pt upcoming appt.

## 2015-09-29 NOTE — Telephone Encounter (Signed)
Appt cancelled. Note in chart:  Patient (Samuel Oneill, [employer] [facility] calling stating that we are not covered by pt ins and need to cancel, the will go to someone who is)

## 2015-10-06 ENCOUNTER — Ambulatory Visit: Payer: Medicare (Managed Care) | Admitting: Surgery

## 2015-10-08 ENCOUNTER — Encounter: Payer: Medicare (Managed Care) | Admitting: Nurse Practitioner

## 2016-06-04 IMAGING — CR DG CHEST 1V
1 series · 1 of 1 positions shown · non-contrast
Comparison: 09/20/2015

CLINICAL DATA: Cough, shortness of breath, and congestion.

EXAM:
CHEST 1 VIEW

[portable]
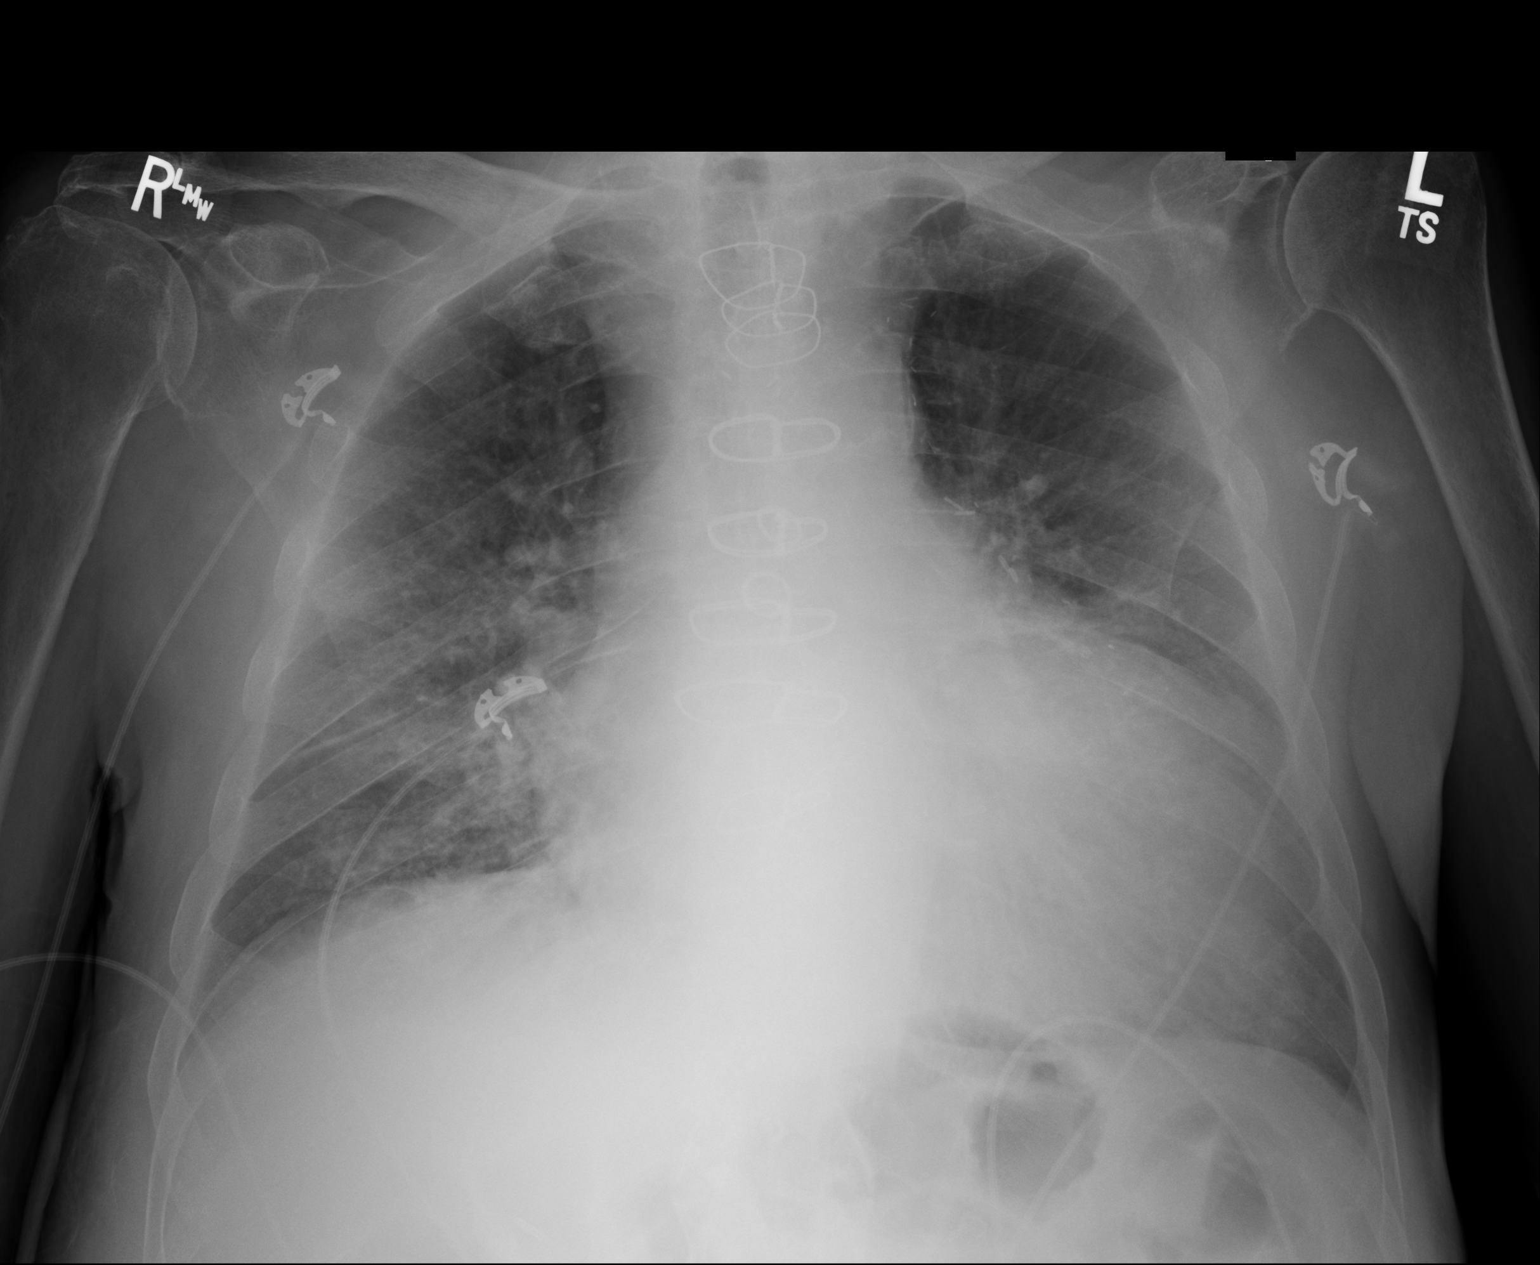

[1 of 1 positions shown; findings below may reference images not displayed]

FINDINGS: Sequelae of prior CABG are again identified. Cardiac silhouette
remains moderately enlarged. Mild pulmonary vascular congestion is
unchanged with mild interstitial prominence and asymmetric right
perihilar and right basilar opacities, overall similar to the prior
study. No pleural effusion or pneumothorax is identified.
IMPRESSION: Unchanged cardiomegaly, mild pulmonary vascular congestion, and
right greater than left perihilar and bibasilar opacities. Findings
may reflect mild edema, although right lower lobe pneumonia is also
possible.

## 2018-09-24 DEATH — deceased
